# Patient Record
Sex: Female | Born: 1955 | Race: Black or African American | Hispanic: No | Marital: Single | Smoking: Never smoker
Health system: Southern US, Community
[De-identification: ages and names within clinical notes are randomized; demographics above are authoritative.]

## PROBLEM LIST (undated history)

## (undated) DIAGNOSIS — K219 Gastro-esophageal reflux disease without esophagitis: Secondary | ICD-10-CM

## (undated) DIAGNOSIS — I700 Atherosclerosis of aorta: Secondary | ICD-10-CM

## (undated) DIAGNOSIS — N189 Chronic kidney disease, unspecified: Secondary | ICD-10-CM

## (undated) DIAGNOSIS — Z8541 Personal history of malignant neoplasm of cervix uteri: Secondary | ICD-10-CM

## (undated) DIAGNOSIS — I10 Essential (primary) hypertension: Secondary | ICD-10-CM

## (undated) DIAGNOSIS — F1411 Cocaine abuse, in remission: Secondary | ICD-10-CM

## (undated) DIAGNOSIS — T7840XA Allergy, unspecified, initial encounter: Secondary | ICD-10-CM

## (undated) DIAGNOSIS — E559 Vitamin D deficiency, unspecified: Secondary | ICD-10-CM

## (undated) DIAGNOSIS — D649 Anemia, unspecified: Secondary | ICD-10-CM

## (undated) DIAGNOSIS — I708 Atherosclerosis of other arteries: Secondary | ICD-10-CM

## (undated) DIAGNOSIS — B182 Chronic viral hepatitis C: Secondary | ICD-10-CM

## (undated) DIAGNOSIS — C801 Malignant (primary) neoplasm, unspecified: Secondary | ICD-10-CM

## (undated) DIAGNOSIS — E785 Hyperlipidemia, unspecified: Secondary | ICD-10-CM

## (undated) DIAGNOSIS — Z905 Acquired absence of kidney: Secondary | ICD-10-CM

## (undated) DIAGNOSIS — A048 Other specified bacterial intestinal infections: Secondary | ICD-10-CM

## (undated) DIAGNOSIS — F329 Major depressive disorder, single episode, unspecified: Secondary | ICD-10-CM

## (undated) HISTORY — DX: Cocaine abuse, in remission: F14.11

## (undated) HISTORY — DX: Vitamin D deficiency, unspecified: E55.9

## (undated) HISTORY — DX: Atherosclerosis of aorta: I70.0

## (undated) HISTORY — PX: BOWEL RESECTION: SHX1257

## (undated) HISTORY — DX: Other specified bacterial intestinal infections: A04.8

## (undated) HISTORY — DX: Allergy, unspecified, initial encounter: T78.40XA

## (undated) HISTORY — DX: Acquired absence of kidney: Z90.5

## (undated) HISTORY — DX: Essential (primary) hypertension: I10

## (undated) HISTORY — DX: Major depressive disorder, single episode, unspecified: F32.9

## (undated) HISTORY — DX: Hyperlipidemia, unspecified: E78.5

## (undated) HISTORY — DX: Chronic viral hepatitis C: B18.2

## (undated) HISTORY — DX: Atherosclerosis of other arteries: I70.8

## (undated) HISTORY — DX: Gastro-esophageal reflux disease without esophagitis: K21.9

## (undated) HISTORY — DX: Personal history of malignant neoplasm of cervix uteri: Z85.41

## (undated) HISTORY — PX: ABDOMINAL HYSTERECTOMY: SHX81

## (undated) HISTORY — PX: OTHER SURGICAL HISTORY: SHX169

---

## 2003-03-17 ENCOUNTER — Encounter: Payer: Self-pay | Admitting: Emergency Medicine

## 2003-03-17 ENCOUNTER — Emergency Department (HOSPITAL_COMMUNITY): Admission: EM | Admit: 2003-03-17 | Discharge: 2003-03-17 | Payer: Self-pay | Admitting: Emergency Medicine

## 2003-03-30 ENCOUNTER — Encounter: Admission: RE | Admit: 2003-03-30 | Discharge: 2003-03-30 | Payer: Self-pay | Admitting: Internal Medicine

## 2003-04-09 ENCOUNTER — Encounter: Admission: RE | Admit: 2003-04-09 | Discharge: 2003-04-09 | Payer: Self-pay | Admitting: Infectious Diseases

## 2003-04-16 ENCOUNTER — Encounter: Admission: RE | Admit: 2003-04-16 | Discharge: 2003-04-16 | Payer: Self-pay | Admitting: Internal Medicine

## 2003-05-11 ENCOUNTER — Encounter: Admission: RE | Admit: 2003-05-11 | Discharge: 2003-05-11 | Payer: Self-pay | Admitting: Internal Medicine

## 2003-05-25 ENCOUNTER — Encounter: Admission: RE | Admit: 2003-05-25 | Discharge: 2003-05-25 | Payer: Self-pay | Admitting: Internal Medicine

## 2003-09-13 ENCOUNTER — Encounter: Admission: RE | Admit: 2003-09-13 | Discharge: 2003-09-13 | Payer: Self-pay | Admitting: Internal Medicine

## 2003-10-11 ENCOUNTER — Encounter: Admission: RE | Admit: 2003-10-11 | Discharge: 2003-10-11 | Payer: Self-pay | Admitting: Obstetrics and Gynecology

## 2003-12-14 ENCOUNTER — Encounter: Admission: RE | Admit: 2003-12-14 | Discharge: 2003-12-14 | Payer: Self-pay | Admitting: Internal Medicine

## 2004-02-14 ENCOUNTER — Encounter: Admission: RE | Admit: 2004-02-14 | Discharge: 2004-02-14 | Payer: Self-pay | Admitting: Internal Medicine

## 2004-03-10 ENCOUNTER — Encounter: Admission: RE | Admit: 2004-03-10 | Discharge: 2004-03-10 | Payer: Self-pay | Admitting: Internal Medicine

## 2004-04-14 ENCOUNTER — Encounter: Admission: RE | Admit: 2004-04-14 | Discharge: 2004-04-14 | Payer: Self-pay | Admitting: Internal Medicine

## 2004-04-28 ENCOUNTER — Encounter: Admission: RE | Admit: 2004-04-28 | Discharge: 2004-04-28 | Payer: Self-pay | Admitting: Internal Medicine

## 2004-04-30 ENCOUNTER — Encounter: Admission: RE | Admit: 2004-04-30 | Discharge: 2004-04-30 | Payer: Self-pay | Admitting: Internal Medicine

## 2004-05-13 ENCOUNTER — Ambulatory Visit: Payer: Self-pay | Admitting: Internal Medicine

## 2004-07-16 ENCOUNTER — Ambulatory Visit: Payer: Self-pay | Admitting: Internal Medicine

## 2004-09-17 ENCOUNTER — Ambulatory Visit: Payer: Self-pay | Admitting: Internal Medicine

## 2004-10-17 ENCOUNTER — Emergency Department (HOSPITAL_COMMUNITY): Admission: EM | Admit: 2004-10-17 | Discharge: 2004-10-17 | Payer: Self-pay | Admitting: Family Medicine

## 2004-12-03 ENCOUNTER — Ambulatory Visit: Payer: Self-pay | Admitting: Internal Medicine

## 2004-12-31 ENCOUNTER — Emergency Department (HOSPITAL_COMMUNITY): Admission: EM | Admit: 2004-12-31 | Discharge: 2004-12-31 | Payer: Self-pay | Admitting: Emergency Medicine

## 2005-03-13 ENCOUNTER — Ambulatory Visit: Payer: Self-pay | Admitting: Internal Medicine

## 2005-05-21 ENCOUNTER — Ambulatory Visit: Payer: Self-pay | Admitting: Internal Medicine

## 2006-07-19 DIAGNOSIS — Z8541 Personal history of malignant neoplasm of cervix uteri: Secondary | ICD-10-CM | POA: Insufficient documentation

## 2006-07-19 DIAGNOSIS — E785 Hyperlipidemia, unspecified: Secondary | ICD-10-CM

## 2006-07-19 DIAGNOSIS — I10 Essential (primary) hypertension: Secondary | ICD-10-CM | POA: Insufficient documentation

## 2006-07-19 DIAGNOSIS — Z905 Acquired absence of kidney: Secondary | ICD-10-CM | POA: Insufficient documentation

## 2006-07-19 DIAGNOSIS — R109 Unspecified abdominal pain: Secondary | ICD-10-CM | POA: Insufficient documentation

## 2006-07-19 DIAGNOSIS — F329 Major depressive disorder, single episode, unspecified: Secondary | ICD-10-CM | POA: Insufficient documentation

## 2006-07-19 DIAGNOSIS — F3289 Other specified depressive episodes: Secondary | ICD-10-CM | POA: Insufficient documentation

## 2006-07-19 DIAGNOSIS — Z9189 Other specified personal risk factors, not elsewhere classified: Secondary | ICD-10-CM | POA: Insufficient documentation

## 2006-07-19 DIAGNOSIS — K520 Gastroenteritis and colitis due to radiation: Secondary | ICD-10-CM

## 2006-08-10 ENCOUNTER — Encounter (INDEPENDENT_AMBULATORY_CARE_PROVIDER_SITE_OTHER): Payer: Self-pay | Admitting: Internal Medicine

## 2013-11-17 ENCOUNTER — Telehealth: Payer: Self-pay | Admitting: *Deleted

## 2013-12-08 NOTE — Telephone Encounter (Signed)
Open In Error/sls

## 2014-05-18 ENCOUNTER — Encounter: Payer: Self-pay | Admitting: Internal Medicine

## 2014-05-18 ENCOUNTER — Ambulatory Visit: Payer: Medicare Other | Attending: Internal Medicine | Admitting: Internal Medicine

## 2014-05-18 VITALS — BP 180/100 | HR 70 | Temp 97.8°F | Resp 16 | Wt 133.6 lb

## 2014-05-18 DIAGNOSIS — I10 Essential (primary) hypertension: Secondary | ICD-10-CM | POA: Diagnosis not present

## 2014-05-18 DIAGNOSIS — IMO0001 Reserved for inherently not codable concepts without codable children: Secondary | ICD-10-CM

## 2014-05-18 DIAGNOSIS — Z139 Encounter for screening, unspecified: Secondary | ICD-10-CM

## 2014-05-18 DIAGNOSIS — K219 Gastro-esophageal reflux disease without esophagitis: Secondary | ICD-10-CM

## 2014-05-18 DIAGNOSIS — R030 Elevated blood-pressure reading, without diagnosis of hypertension: Secondary | ICD-10-CM

## 2014-05-18 LAB — CBC WITH DIFFERENTIAL/PLATELET
BASOS ABS: 0 10*3/uL (ref 0.0–0.1)
BASOS PCT: 0 % (ref 0–1)
EOS PCT: 2 % (ref 0–5)
Eosinophils Absolute: 0.1 10*3/uL (ref 0.0–0.7)
HEMATOCRIT: 34.4 % — AB (ref 36.0–46.0)
Hemoglobin: 11.5 g/dL — ABNORMAL LOW (ref 12.0–15.0)
LYMPHS PCT: 40 % (ref 12–46)
Lymphs Abs: 2.8 10*3/uL (ref 0.7–4.0)
MCH: 30.6 pg (ref 26.0–34.0)
MCHC: 33.4 g/dL (ref 30.0–36.0)
MCV: 91.5 fL (ref 78.0–100.0)
MONO ABS: 0.5 10*3/uL (ref 0.1–1.0)
Monocytes Relative: 7 % (ref 3–12)
Neutro Abs: 3.5 10*3/uL (ref 1.7–7.7)
Neutrophils Relative %: 51 % (ref 43–77)
Platelets: 381 10*3/uL (ref 150–400)
RBC: 3.76 MIL/uL — ABNORMAL LOW (ref 3.87–5.11)
RDW: 14.7 % (ref 11.5–15.5)
WBC: 6.9 10*3/uL (ref 4.0–10.5)

## 2014-05-18 MED ORDER — AMLODIPINE BESYLATE 5 MG PO TABS: 5.0000 mg | ORAL_TABLET | Freq: Every day | ORAL | Status: DC

## 2014-05-18 MED ORDER — LISINOPRIL-HYDROCHLOROTHIAZIDE 20-25 MG PO TABS: 1.0000 | ORAL_TABLET | Freq: Every day | ORAL | Status: DC

## 2014-05-18 MED ORDER — METOPROLOL SUCCINATE ER 200 MG PO TB24: 200.0000 mg | ORAL_TABLET | Freq: Every day | ORAL | Status: DC

## 2014-05-18 MED ORDER — CLONIDINE HCL 0.1 MG PO TABS
0.2000 mg | ORAL_TABLET | Freq: Once | ORAL | Status: AC
  Administered 2014-05-18: 0.2 mg via ORAL

## 2014-05-18 MED ORDER — OMEPRAZOLE 40 MG PO CPDR: 40.0000 mg | DELAYED_RELEASE_CAPSULE | Freq: Every day | ORAL | Status: DC

## 2014-05-18 NOTE — Progress Notes (Signed)
Patient Demographics  Janice Deleon, is a 58 y.o. female  PPJ:[id number]  PYK:[id number]  DOB - 22-Feb-1956  CC:  Chief Complaint  Patient presents with  . Establish Care       HPI: Janice Deleon is a 58 y.o. female here today to establish medical care. History of hypertension, hyperlipidemia, patient reported to be on several blood pressure medications including lisinopril metoprolol, Adalat  and hydrochlorothiazide, as per patient she ran out of her blood pressure medication for the last 2 months her blood pressure today is elevated, she was given clonidine even repeat manual blood pressure is 180/100 she denies any headache dizziness chest and shortness of breath. Patient has No headache, No chest pain, No abdominal pain - No Nausea, No new weakness tingling or numbness, No Cough - SOB.  Allergies  Allergen Reactions  . Hydrocodone-Acetaminophen     REACTION: Unknown reaction   Past Medical History  Diagnosis Date  . Hypertension   . GERD (gastroesophageal reflux disease)    No current outpatient prescriptions on file prior to visit.   No current facility-administered medications on file prior to visit.   Family History  Problem Relation Age of Onset  . Hypertension Mother   . Diabetes Mother   . Heart disease Mother   . Hypertension Father   . Cancer Father     colon cancer   . Hypertension Sister   . Diabetes Sister   . Hypertension Brother   . Diabetes Brother   . Stroke Maternal Aunt    History   Social History  . Marital Status: Single    Spouse Name: N/A    Number of Children: N/A  . Years of Education: N/A   Occupational History  . Not on file.   Social History Main Topics  . Smoking status: Never Smoker   . Smokeless tobacco: Not on file  . Alcohol Use: Yes  . Drug Use: Not on file  . Sexual Activity: Not on file   Other Topics Concern  . Not on file   Social History Narrative  . No narrative on file    Review of  Systems: Constitutional: Negative for fever, chills, diaphoresis, activity change, appetite change and fatigue. HENT: Negative for ear pain, nosebleeds, congestion, facial swelling, rhinorrhea, neck pain, neck stiffness and ear discharge.  Eyes: Negative for pain, discharge, redness, itching and visual disturbance. Respiratory: Negative for cough, choking, chest tightness, shortness of breath, wheezing and stridor.  Cardiovascular: Negative for chest pain, palpitations and leg swelling. Gastrointestinal: Negative for abdominal distention. Genitourinary: Negative for dysuria, urgency, frequency, hematuria, flank pain, decreased urine volume, difficulty urinating and dyspareunia.  Musculoskeletal: Negative for back pain, joint swelling, arthralgia and gait problem. Neurological: Negative for dizziness, tremors, seizures, syncope, facial asymmetry, speech difficulty, weakness, light-headedness, numbness and headaches.  Hematological: Negative for adenopathy. Does not bruise/bleed easily. Psychiatric/Behavioral: Negative for hallucinations, behavioral problems, confusion, dysphoric mood, decreased concentration and agitation.    Objective:   Filed Vitals:   05/18/14 1635  BP: 180/100  Pulse:   Temp:   Resp:     Physical Exam: Constitutional: Patient appears well-developed and well-nourished. No distress. HENT: Normocephalic, atraumatic, External right and left ear normal. Oropharynx is clear and moist.  Eyes: Conjunctivae and EOM are normal. PERRLA, no scleral icterus. Neck: Normal ROM. Neck supple. No JVD. No tracheal deviation. No thyromegaly. CVS: RRR, S1/S2 +, no murmurs, no gallops, no carotid bruit.  Pulmonary: Effort and breath sounds normal, no stridor, rhonchi,  wheezes, rales.  Abdominal: Soft. BS +, no distension, tenderness, rebound or guarding. Old surgical scars Musculoskeletal: Normal range of motion. No edema and no tenderness.  Neuro: Alert. Normal reflexes, muscle tone  coordination. No cranial nerve deficit. Skin: Skin is warm and dry. No rash noted. Not diaphoretic. No erythema. No pallor. Psychiatric: Normal mood and affect. Behavior, judgment, thought content normal.  No results found for this basename: WBC, HGB, HCT, MCV, PLT   No results found for this basename: CREATININE, BUN, NA, K, CL, CO2    No results found for this basename: HGBA1C   Lipid Panel  No results found for this basename: chol, trig, hdl, cholhdl, vldl, ldlcalc       Assessment and plan:   1. Elevated blood pressure  - cloNIDine (CATAPRES) tablet 0.2 mg; Take 2 tablets (0.2 mg total) by mouth once.  2. Essential hypertension, benign Have advised patient for DASH diet, since her blood pressures on lower side have discontinued her nifedipine and switch to Norvasc, prescribed combination of lisinopril and hydrochlorothiazide, continue with metoprolol, she'll come back in 2 weeks for BP check. - COMPLETE METABOLIC PANEL WITH GFR - metoprolol (TOPROL-XL) 200 MG 24 hr tablet; Take 1 tablet (200 mg total) by mouth daily.  Dispense: 30 tablet; Refill: 3 - lisinopril-hydrochlorothiazide (PRINZIDE,ZESTORETIC) 20-25 MG per tablet; Take 1 tablet by mouth daily.  Dispense: 90 tablet; Refill: 3 - amLODipine (NORVASC) 5 MG tablet; Take 1 tablet (5 mg total) by mouth daily.  Dispense: 90 tablet; Refill: 3  3. Screening Ordered baseline blood work - CBC with Differential - TSH - Vit D  25 hydroxy (rtn osteoporosis monitoring) - MM DIGITAL SCREENING BILATERAL; Future - Hemoglobin A1c  4. Gastroesophageal reflux disease, esophagitis presence not specified Advised for lifestyle modification continue with her - omeprazole (PRILOSEC) 40 MG capsule; Take 1 capsule (40 mg total) by mouth daily.  Dispense: 30 capsule; Refill: 3     Health Maintenance -Colonoscopy: uptodate   -Mammogram: ordered  Patient declined flu shot    Return in about 3 months (around 08/17/2014) for hypertension,  BP check in 2 weeks/Nurse Visit.     Lorayne Marek, MD

## 2014-05-18 NOTE — Progress Notes (Signed)
Patient here to establish care Has not had her medications in about two months Presents in office with elevated blood pressure Patient has declined the flu vaccine

## 2014-05-18 NOTE — Patient Instructions (Signed)
DASH Eating Plan °DASH stands for "Dietary Approaches to Stop Hypertension." The DASH eating plan is a healthy eating plan that has been shown to reduce high blood pressure (hypertension). Additional health benefits may include reducing the risk of type 2 diabetes mellitus, heart disease, and stroke. The DASH eating plan may also help with weight loss. °WHAT DO I NEED TO KNOW ABOUT THE DASH EATING PLAN? °For the DASH eating plan, you will follow these general guidelines: °· Choose foods with a percent daily value for sodium of less than 5% (as listed on the food label). °· Use salt-free seasonings or herbs instead of table salt or sea salt. °· Check with your health care provider or pharmacist before using salt substitutes. °· Eat lower-sodium products, often labeled as "lower sodium" or "no salt added." °· Eat fresh foods. °· Eat more vegetables, fruits, and low-fat dairy products. °· Choose whole grains. Look for the word "whole" as the first word in the ingredient list. °· Choose fish and skinless chicken or turkey more often than red meat. Limit fish, poultry, and meat to 6 oz (170 g) each day. °· Limit sweets, desserts, sugars, and sugary drinks. °· Choose heart-healthy fats. °· Limit cheese to 1 oz (28 g) per day. °· Eat more home-cooked food and less restaurant, buffet, and fast food. °· Limit fried foods. °· Cook foods using methods other than frying. °· Limit canned vegetables. If you do use them, rinse them well to decrease the sodium. °· When eating at a restaurant, ask that your food be prepared with less salt, or no salt if possible. °WHAT FOODS CAN I EAT? °Seek help from a dietitian for individual calorie needs. °Grains °Whole grain or whole wheat bread. Brown rice. Whole grain or whole wheat pasta. Quinoa, bulgur, and whole grain cereals. Low-sodium cereals. Corn or whole wheat flour tortillas. Whole grain cornbread. Whole grain crackers. Low-sodium crackers. °Vegetables °Fresh or frozen vegetables  (raw, steamed, roasted, or grilled). Low-sodium or reduced-sodium tomato and vegetable juices. Low-sodium or reduced-sodium tomato sauce and paste. Low-sodium or reduced-sodium canned vegetables.  °Fruits °All fresh, canned (in natural juice), or frozen fruits. °Meat and Other Protein Products °Ground beef (85% or leaner), grass-fed beef, or beef trimmed of fat. Skinless chicken or turkey. Ground chicken or turkey. Pork trimmed of fat. All fish and seafood. Eggs. Dried beans, peas, or lentils. Unsalted nuts and seeds. Unsalted canned beans. °Dairy °Low-fat dairy products, such as skim or 1% milk, 2% or reduced-fat cheeses, low-fat ricotta or cottage cheese, or plain low-fat yogurt. Low-sodium or reduced-sodium cheeses. °Fats and Oils °Tub margarines without trans fats. Light or reduced-fat mayonnaise and salad dressings (reduced sodium). Avocado. Safflower, olive, or canola oils. Natural peanut or almond butter. °Other °Unsalted popcorn and pretzels. °The items listed above may not be a complete list of recommended foods or beverages. Contact your dietitian for more options. °WHAT FOODS ARE NOT RECOMMENDED? °Grains °White bread. White pasta. White rice. Refined cornbread. Bagels and croissants. Crackers that contain trans fat. °Vegetables °Creamed or fried vegetables. Vegetables in a cheese sauce. Regular canned vegetables. Regular canned tomato sauce and paste. Regular tomato and vegetable juices. °Fruits °Dried fruits. Canned fruit in light or heavy syrup. Fruit juice. °Meat and Other Protein Products °Fatty cuts of meat. Ribs, chicken wings, bacon, sausage, bologna, salami, chitterlings, fatback, hot dogs, bratwurst, and packaged luncheon meats. Salted nuts and seeds. Canned beans with salt. °Dairy °Whole or 2% milk, cream, half-and-half, and cream cheese. Whole-fat or sweetened yogurt. Full-fat   cheeses or blue cheese. Nondairy creamers and whipped toppings. Processed cheese, cheese spreads, or cheese  curds. °Condiments °Onion and garlic salt, seasoned salt, table salt, and sea salt. Canned and packaged gravies. Worcestershire sauce. Tartar sauce. Barbecue sauce. Teriyaki sauce. Soy sauce, including reduced sodium. Steak sauce. Fish sauce. Oyster sauce. Cocktail sauce. Horseradish. Ketchup and mustard. Meat flavorings and tenderizers. Bouillon cubes. Hot sauce. Tabasco sauce. Marinades. Taco seasonings. Relishes. °Fats and Oils °Butter, stick margarine, lard, shortening, ghee, and bacon fat. Coconut, palm kernel, or palm oils. Regular salad dressings. °Other °Pickles and olives. Salted popcorn and pretzels. °The items listed above may not be a complete list of foods and beverages to avoid. Contact your dietitian for more information. °WHERE CAN I FIND MORE INFORMATION? °National Heart, Lung, and Blood Institute: www.nhlbi.nih.gov/health/health-topics/topics/dash/ °Document Released: 08/06/2011 Document Revised: 01/01/2014 Document Reviewed: 06/21/2013 °Exit[employer]® Patient Information ©2015 [employer]. This information is not intended to replace advice given to you by your health care provider. Make sure you discuss any questions you have with your health care provider. ° °

## 2014-05-19 LAB — COMPLETE METABOLIC PANEL WITH GFR
ALBUMIN: 4.2 g/dL (ref 3.5–5.2)
ALT: 10 U/L (ref 0–35)
AST: 15 U/L (ref 0–37)
Alkaline Phosphatase: 70 U/L (ref 39–117)
BUN: 9 mg/dL (ref 6–23)
CALCIUM: 9.3 mg/dL (ref 8.4–10.5)
CO2: 29 meq/L (ref 19–32)
Chloride: 105 mEq/L (ref 96–112)
Creat: 0.66 mg/dL (ref 0.50–1.10)
GLUCOSE: 89 mg/dL (ref 70–99)
Potassium: 4.3 mEq/L (ref 3.5–5.3)
Sodium: 139 mEq/L (ref 135–145)
Total Bilirubin: 0.2 mg/dL (ref 0.2–1.2)
Total Protein: 7 g/dL (ref 6.0–8.3)

## 2014-05-19 LAB — TSH: TSH: 2.402 u[IU]/mL (ref 0.350–4.500)

## 2014-05-19 LAB — HEMOGLOBIN A1C
Hgb A1c MFr Bld: 5.7 % — ABNORMAL HIGH (ref <5.7)
MEAN PLASMA GLUCOSE: 117 mg/dL — AB (ref <117)

## 2014-05-19 LAB — VITAMIN D 25 HYDROXY (VIT D DEFICIENCY, FRACTURES): VIT D 25 HYDROXY: 26 ng/mL — AB (ref 30–89)

## 2014-05-21 ENCOUNTER — Telehealth: Payer: Self-pay | Admitting: Emergency Medicine

## 2014-05-21 MED ORDER — VITAMIN D (ERGOCALCIFEROL) 1.25 MG (50000 UNIT) PO CAPS: 50000.0000 [IU] | ORAL_CAPSULE | ORAL | Status: DC

## 2014-05-21 NOTE — Telephone Encounter (Signed)
Message copied by Ricci Barker on Mon May 21, 2014  2:31 PM ------      Message from: Lorayne Marek      Created: Mon May 21, 2014  1:21 PM       Blood work reviewed, noticed low vitamin D, call patient advise to start ergocalciferol 50,000 units once a week for the duration of  12 weeks.      Also patient has borderline anemia, advise patient take over-the-counter iron supplements daily.       ------

## 2014-05-21 NOTE — Telephone Encounter (Signed)
Pt given lab results with instructions to start taking prescribed Ergocalciferol 50,000 units once a week  Medication will be e-scribed to [facility] for pt to receive Pt also instructed to start taking OTC iron supplements for low iron

## 2014-06-14 ENCOUNTER — Ambulatory Visit (HOSPITAL_COMMUNITY): Admission: RE | Admit: 2014-06-14 | Payer: Medicaid Other | Source: Ambulatory Visit

## 2014-06-27 ENCOUNTER — Other Ambulatory Visit: Payer: Self-pay | Admitting: Internal Medicine

## 2014-06-27 ENCOUNTER — Ambulatory Visit (HOSPITAL_COMMUNITY)
Admission: RE | Admit: 2014-06-27 | Discharge: 2014-06-27 | Disposition: A | Discharge status: Home / Self Care | Payer: Medicare Other | Source: Ambulatory Visit | Attending: Internal Medicine | Admitting: Internal Medicine

## 2014-06-27 DIAGNOSIS — Z1231 Encounter for screening mammogram for malignant neoplasm of breast: Secondary | ICD-10-CM

## 2014-06-27 DIAGNOSIS — Z139 Encounter for screening, unspecified: Secondary | ICD-10-CM

## 2014-09-11 ENCOUNTER — Ambulatory Visit: Payer: Medicare Other | Admitting: Internal Medicine

## 2014-10-23 ENCOUNTER — Encounter: Payer: Self-pay | Admitting: Internal Medicine

## 2014-10-23 ENCOUNTER — Ambulatory Visit: Payer: Medicare Other | Attending: Internal Medicine | Admitting: Internal Medicine

## 2014-10-23 VITALS — BP 165/90 | HR 72 | Temp 99.2°F | Resp 16 | Wt 142.0 lb

## 2014-10-23 DIAGNOSIS — D649 Anemia, unspecified: Secondary | ICD-10-CM | POA: Diagnosis not present

## 2014-10-23 DIAGNOSIS — R350 Frequency of micturition: Secondary | ICD-10-CM | POA: Diagnosis not present

## 2014-10-23 DIAGNOSIS — I10 Essential (primary) hypertension: Secondary | ICD-10-CM | POA: Insufficient documentation

## 2014-10-23 DIAGNOSIS — IMO0001 Reserved for inherently not codable concepts without codable children: Secondary | ICD-10-CM

## 2014-10-23 DIAGNOSIS — E785 Hyperlipidemia, unspecified: Secondary | ICD-10-CM | POA: Diagnosis not present

## 2014-10-23 DIAGNOSIS — R030 Elevated blood-pressure reading, without diagnosis of hypertension: Secondary | ICD-10-CM | POA: Diagnosis not present

## 2014-10-23 DIAGNOSIS — Z79899 Other long term (current) drug therapy: Secondary | ICD-10-CM | POA: Diagnosis not present

## 2014-10-23 DIAGNOSIS — K219 Gastro-esophageal reflux disease without esophagitis: Secondary | ICD-10-CM | POA: Diagnosis not present

## 2014-10-23 DIAGNOSIS — E559 Vitamin D deficiency, unspecified: Secondary | ICD-10-CM | POA: Diagnosis not present

## 2014-10-23 LAB — POCT URINALYSIS DIPSTICK
BILIRUBIN UA: NEGATIVE
Glucose, UA: NEGATIVE
Ketones, UA: NEGATIVE
Leukocytes, UA: NEGATIVE
NITRITE UA: NEGATIVE
Protein, UA: NEGATIVE
RBC UA: NEGATIVE
Urobilinogen, UA: 0.2
pH, UA: 5.5

## 2014-10-23 LAB — COMPLETE METABOLIC PANEL WITH GFR
ALT: 13 U/L (ref 0–35)
AST: 12 U/L (ref 0–37)
Albumin: 4.2 g/dL (ref 3.5–5.2)
Alkaline Phosphatase: 72 U/L (ref 39–117)
BILIRUBIN TOTAL: 0.3 mg/dL (ref 0.2–1.2)
BUN: 12 mg/dL (ref 6–23)
CO2: 26 meq/L (ref 19–32)
CREATININE: 0.75 mg/dL (ref 0.50–1.10)
Calcium: 9.6 mg/dL (ref 8.4–10.5)
Chloride: 104 mEq/L (ref 96–112)
GFR, Est Non African American: 89 mL/min
Glucose, Bld: 91 mg/dL (ref 70–99)
POTASSIUM: 4.5 meq/L (ref 3.5–5.3)
SODIUM: 137 meq/L (ref 135–145)
Total Protein: 7.5 g/dL (ref 6.0–8.3)

## 2014-10-23 MED ORDER — OMEPRAZOLE 20 MG PO CPDR: 20.0000 mg | DELAYED_RELEASE_CAPSULE | Freq: Every day | ORAL | Status: DC

## 2014-10-23 MED ORDER — CLONIDINE HCL 0.1 MG PO TABS
0.2000 mg | ORAL_TABLET | Freq: Once | ORAL | Status: AC
  Administered 2014-10-23: 0.2 mg via ORAL

## 2014-10-23 MED ORDER — METOPROLOL SUCCINATE ER 200 MG PO TB24: 200.0000 mg | ORAL_TABLET | Freq: Every day | ORAL | Status: DC

## 2014-10-23 MED ORDER — AMLODIPINE BESYLATE 5 MG PO TABS: 5.0000 mg | ORAL_TABLET | Freq: Every day | ORAL | Status: DC

## 2014-10-23 MED ORDER — LISINOPRIL-HYDROCHLOROTHIAZIDE 20-25 MG PO TABS: 1.0000 | ORAL_TABLET | Freq: Every day | ORAL | Status: DC

## 2014-10-23 NOTE — Patient Instructions (Addendum)
DASH Eating Plan DASH stands for "Dietary Approaches to Stop Hypertension." The DASH eating plan is a healthy eating plan that has been shown to reduce high blood pressure (hypertension). Additional health benefits may include reducing the risk of type 2 diabetes mellitus, heart disease, and stroke. The DASH eating plan may also help with weight loss. WHAT DO I NEED TO KNOW ABOUT THE DASH EATING PLAN? For the DASH eating plan, you will follow these general guidelines:  Choose foods with a percent daily value for sodium of less than 5% (as listed on the food label).  Use salt-free seasonings or herbs instead of table salt or sea salt.  Check with your health care provider or pharmacist before using salt substitutes.  Eat lower-sodium products, often labeled as "lower sodium" or "no salt added."  Eat fresh foods.  Eat more vegetables, fruits, and low-fat dairy products.  Choose whole grains. Look for the word "whole" as the first word in the ingredient list.  Choose fish and skinless chicken or turkey more often than red meat. Limit fish, poultry, and meat to 6 oz (170 g) each day.  Limit sweets, desserts, sugars, and sugary drinks.  Choose heart-healthy fats.  Limit cheese to 1 oz (28 g) per day.  Eat more home-cooked food and less restaurant, buffet, and fast food.  Limit fried foods.  Cook foods using methods other than frying.  Limit canned vegetables. If you do use them, rinse them well to decrease the sodium.  When eating at a restaurant, ask that your food be prepared with less salt, or no salt if possible. WHAT FOODS CAN I EAT? Seek help from a dietitian for individual calorie needs. Grains Whole grain or whole wheat bread. Brown rice. Whole grain or whole wheat pasta. Quinoa, bulgur, and whole grain cereals. Low-sodium cereals. Corn or whole wheat flour tortillas. Whole grain cornbread. Whole grain crackers. Low-sodium crackers. Vegetables Fresh or frozen vegetables  (raw, steamed, roasted, or grilled). Low-sodium or reduced-sodium tomato and vegetable juices. Low-sodium or reduced-sodium tomato sauce and paste. Low-sodium or reduced-sodium canned vegetables.  Fruits All fresh, canned (in natural juice), or frozen fruits. Meat and Other Protein Products Ground beef (85% or leaner), grass-fed beef, or beef trimmed of fat. Skinless chicken or turkey. Ground chicken or turkey. Pork trimmed of fat. All fish and seafood. Eggs. Dried beans, peas, or lentils. Unsalted nuts and seeds. Unsalted canned beans. Dairy Low-fat dairy products, such as skim or 1% milk, 2% or reduced-fat cheeses, low-fat ricotta or cottage cheese, or plain low-fat yogurt. Low-sodium or reduced-sodium cheeses. Fats and Oils Tub margarines without trans fats. Light or reduced-fat mayonnaise and salad dressings (reduced sodium). Avocado. Safflower, olive, or canola oils. Natural peanut or almond butter. Other Unsalted popcorn and pretzels. The items listed above may not be a complete list of recommended foods or beverages. Contact your dietitian for more options. WHAT FOODS ARE NOT RECOMMENDED? Grains White bread. White pasta. White rice. Refined cornbread. Bagels and croissants. Crackers that contain trans fat. Vegetables Creamed or fried vegetables. Vegetables in a cheese sauce. Regular canned vegetables. Regular canned tomato sauce and paste. Regular tomato and vegetable juices. Fruits Dried fruits. Canned fruit in light or heavy syrup. Fruit juice. Meat and Other Protein Products Fatty cuts of meat. Ribs, chicken wings, bacon, sausage, bologna, salami, chitterlings, fatback, hot dogs, bratwurst, and packaged luncheon meats. Salted nuts and seeds. Canned beans with salt. Dairy Whole or 2% milk, cream, half-and-half, and cream cheese. Whole-fat or sweetened yogurt. Full-fat   cheeses or blue cheese. Nondairy creamers and whipped toppings. Processed cheese, cheese spreads, or cheese  curds. Condiments Onion and garlic salt, seasoned salt, table salt, and sea salt. Canned and packaged gravies. Worcestershire sauce. Tartar sauce. Barbecue sauce. Teriyaki sauce. Soy sauce, including reduced sodium. Steak sauce. Fish sauce. Oyster sauce. Cocktail sauce. Horseradish. Ketchup and mustard. Meat flavorings and tenderizers. Bouillon cubes. Hot sauce. Tabasco sauce. Marinades. Taco seasonings. Relishes. Fats and Oils Butter, stick margarine, lard, shortening, ghee, and bacon fat. Coconut, palm kernel, or palm oils. Regular salad dressings. Other Pickles and olives. Salted popcorn and pretzels. The items listed above may not be a complete list of foods and beverages to avoid. Contact your dietitian for more information. WHERE CAN I FIND MORE INFORMATION? National Heart, Lung, and Blood Institute: travelstabloid.com Document Released: 08/06/2011 Document Revised: 01/01/2014 Document Reviewed: 06/21/2013 [facility] Patient Information 2015 [place], [place]. This information is not intended to replace advice given to you by your health care provider. Make sure you discuss any questions you have with your health care provider.   Also take OTC iron supplement and vitamin D 2000 units every day.

## 2014-10-23 NOTE — Progress Notes (Signed)
MRN: [id number] Name: Janice Deleon  Sex: female Age: 59 y.o. DOB: 1955/12/30  Allergies: Hydrocodone-acetaminophen  Chief Complaint  Patient presents with  . Follow-up    HPI: Patient is 59 y.o. female who has history of hypertension, GERD, hyperlipidemia, comes today for followup requesting refill on her medications, today her blood pressure is elevated since she ran out of her medication for the last 2 months, she was given clonidine, repeat manual blood pressure is 165/90, she denies any headache dizziness chest and shortness of breath, she has been complaining of urinary frequency denies any dysuria, UA in the office today is negative for any infection, patient reported that she drinks a lot of coffee and tea and does not drink water without much, also complaining of GERD symptoms.  Past Medical History  Diagnosis Date  . Hypertension   . GERD (gastroesophageal reflux disease)     Past Surgical History  Procedure Laterality Date  . Abdominal hysterectomy    . Right nephrectomy     . Colonscopy       at the age of 59      Medication List       This list is accurate as of: 10/23/14 11:06 AM.  Always use your most recent med list.               amLODipine 5 MG tablet  Commonly known as:  NORVASC  Take 1 tablet (5 mg total) by mouth daily.     lisinopril-hydrochlorothiazide 20-25 MG per tablet  Commonly known as:  PRINZIDE,ZESTORETIC  Take 1 tablet by mouth daily.     metoprolol 200 MG 24 hr tablet  Commonly known as:  TOPROL-XL  Take 1 tablet (200 mg total) by mouth daily.     omeprazole 20 MG capsule  Commonly known as:  PRILOSEC  Take 1 capsule (20 mg total) by mouth daily.     traMADol 50 MG tablet  Commonly known as:  ULTRAM  Take by mouth every 6 (six) hours as needed.     Vitamin D (Ergocalciferol) 50000 UNITS Caps capsule  Commonly known as:  DRISDOL  Take 1 capsule (50,000 Units total) by mouth every 7 (seven) days.        Meds ordered  this encounter  Medications  . cloNIDine (CATAPRES) tablet 0.2 mg    Sig:   . omeprazole (PRILOSEC) 20 MG capsule    Sig: Take 1 capsule (20 mg total) by mouth daily.    Dispense:  30 capsule    Refill:  3  . metoprolol (TOPROL-XL) 200 MG 24 hr tablet    Sig: Take 1 tablet (200 mg total) by mouth daily.    Dispense:  30 tablet    Refill:  3  . lisinopril-hydrochlorothiazide (PRINZIDE,ZESTORETIC) 20-25 MG per tablet    Sig: Take 1 tablet by mouth daily.    Dispense:  90 tablet    Refill:  3  . amLODipine (NORVASC) 5 MG tablet    Sig: Take 1 tablet (5 mg total) by mouth daily.    Dispense:  90 tablet    Refill:  3     There is no immunization history on file for this patient.  Family History  Problem Relation Age of Onset  . Hypertension Mother   . Diabetes Mother   . Heart disease Mother   . Hypertension Father   . Cancer Father     colon cancer   . Hypertension Sister   . Diabetes  Sister   . Hypertension Brother   . Diabetes Brother   . Stroke Maternal Aunt     History  Substance Use Topics  . Smoking status: Never Smoker   . Smokeless tobacco: Not on file  . Alcohol Use: Yes    Review of Systems   As noted in HPI  Filed Vitals:   10/23/14 1102  BP: 165/90  Pulse:   Temp:   Resp:     Physical Exam  Physical Exam  Constitutional: No distress.  Eyes: EOM are normal. Pupils are equal, round, and reactive to light.  Cardiovascular: Normal rate and regular rhythm.   Pulmonary/Chest: Breath sounds normal. No respiratory distress. She has no wheezes. She has no rales.  Abdominal: Soft. There is no tenderness. There is no rebound.  Musculoskeletal: She exhibits no edema.    CBC    Component Value Date/Time   WBC 6.9 05/18/2014 1641   RBC 3.76* 05/18/2014 1641   HGB 11.5* 05/18/2014 1641   HCT 34.4* 05/18/2014 1641   PLT 381 05/18/2014 1641   MCV 91.5 05/18/2014 1641   LYMPHSABS 2.8 05/18/2014 1641   MONOABS 0.5 05/18/2014 1641   EOSABS 0.1  05/18/2014 1641   BASOSABS 0.0 05/18/2014 1641    CMP     Component Value Date/Time   NA 139 05/18/2014 1641   K 4.3 05/18/2014 1641   CL 105 05/18/2014 1641   CO2 29 05/18/2014 1641   GLUCOSE 89 05/18/2014 1641   BUN 9 05/18/2014 1641   CREATININE 0.66 05/18/2014 1641   CALCIUM 9.3 05/18/2014 1641   PROT 7.0 05/18/2014 1641   ALBUMIN 4.2 05/18/2014 1641   AST 15 05/18/2014 1641   ALT 10 05/18/2014 1641   ALKPHOS 70 05/18/2014 1641   BILITOT 0.2 05/18/2014 1641   GFRNONAA >89 05/18/2014 1641   GFRAA >89 05/18/2014 1641    No results found for: CHOL  No components found for: HGA1C  Lab Results  Component Value Date/Time   AST 15 05/18/2014 04:41 PM    Assessment and Plan  Essential hypertension, benign - Plan: blood pressure is uncontrolled secondary to not being on the medication, will resume back on her meds, also advise for DASH diet, advise patient .metoprolol (TOPROL-XL) 200 MG 24 hr tablet, lisinopril-hydrochlorothiazide (PRINZIDE,ZESTORETIC) 20-25 MG per tablet, amLODipine (NORVASC) 5 MG tablet, COMPLETE METABOLIC PANEL WITH GFR  Elevated blood pressure - Plan: cloNIDine (CATAPRES) tablet 0.2 mg  Frequent urination - Plan:  Results for orders placed or performed in visit on 10/23/14  Urinalysis Dipstick  Result Value Ref Range   Color, UA yellow    Clarity, UA clear    Glucose, UA neg    Bilirubin, UA neg    Ketones, UA neg    Spec Grav, UA <=1.005    Blood, UA neg    pH, UA 5.5    Protein, UA neg    Urobilinogen, UA 0.2    Nitrite, UA neg    Leukocytes, UA Negative    Urinalysis Dipstick is negative for infection, I have advised patient to decrease the caffeine intake.  Vitamin D deficiency Patient will start taking over-the-counter vitamin D 2000 units every  Gastroesophageal reflux disease, esophagitis presence not specified - Plan:advised patient for lifestyle modification, prescribed omeprazole (PRILOSEC) 20 MG capsule  Anemia, unspecified  anemia type Patient will start taking over-the-counter iron pills  Health Maintenance -Colonoscopy: as per patient had one within 5 years obtain the records  -Pap Smear: will be scheduled  -  Mammogram: uptodate  -Vaccinations:  -patient declines flu shot    Return in about 3 months (around 01/21/2015) for hypertension, BP check in 2 weeks/Nurse Visit, Schedule Appt with Dr Burman Freestone for PAP.   This note has been created with Surveyor, quantity. Any transcriptional errors are unintentional.    Lorayne Marek, MD

## 2014-10-23 NOTE — Progress Notes (Signed)
Patient here for follow up on her HTN Patient has been out of her medications for about two months Presents in office with elevated blood pressure Patient also complains of frequent urination

## 2014-10-25 ENCOUNTER — Telehealth: Payer: Self-pay

## 2014-10-25 NOTE — Telephone Encounter (Signed)
Patient is aware of her lab results 

## 2014-10-25 NOTE — Telephone Encounter (Signed)
-----   Message from Lorayne Marek, MD sent at 10/24/2014  9:14 AM EST ----- Call and let the Patient know that blood work is normal.

## 2014-11-27 ENCOUNTER — Encounter: Payer: Self-pay | Admitting: Family Medicine

## 2014-11-27 ENCOUNTER — Other Ambulatory Visit (HOSPITAL_COMMUNITY)
Admission: RE | Admit: 2014-11-27 | Discharge: 2014-11-27 | Disposition: A | Discharge status: Home / Self Care | Payer: Medicare Other | Source: Ambulatory Visit | Attending: Family Medicine | Admitting: Family Medicine

## 2014-11-27 ENCOUNTER — Ambulatory Visit: Payer: Medicare Other | Attending: Family Medicine | Admitting: Family Medicine

## 2014-11-27 VITALS — BP 160/94 | HR 73 | Temp 98.9°F | Resp 16 | Ht 63.0 in | Wt 140.0 lb

## 2014-11-27 DIAGNOSIS — N898 Other specified noninflammatory disorders of vagina: Secondary | ICD-10-CM | POA: Diagnosis not present

## 2014-11-27 DIAGNOSIS — Z8541 Personal history of malignant neoplasm of cervix uteri: Secondary | ICD-10-CM | POA: Insufficient documentation

## 2014-11-27 DIAGNOSIS — A599 Trichomoniasis, unspecified: Secondary | ICD-10-CM

## 2014-11-27 DIAGNOSIS — Z923 Personal history of irradiation: Secondary | ICD-10-CM | POA: Diagnosis not present

## 2014-11-27 DIAGNOSIS — Z124 Encounter for screening for malignant neoplasm of cervix: Secondary | ICD-10-CM | POA: Diagnosis not present

## 2014-11-27 DIAGNOSIS — B9689 Other specified bacterial agents as the cause of diseases classified elsewhere: Secondary | ICD-10-CM

## 2014-11-27 DIAGNOSIS — Z114 Encounter for screening for human immunodeficiency virus [HIV]: Secondary | ICD-10-CM

## 2014-11-27 DIAGNOSIS — N760 Acute vaginitis: Secondary | ICD-10-CM

## 2014-11-27 DIAGNOSIS — I10 Essential (primary) hypertension: Secondary | ICD-10-CM | POA: Diagnosis not present

## 2014-11-27 DIAGNOSIS — A499 Bacterial infection, unspecified: Secondary | ICD-10-CM

## 2014-11-27 LAB — CERVICOVAGINAL ANCILLARY ONLY: WET PREP (BD AFFIRM): POSITIVE — AB

## 2014-11-27 MED ORDER — CLONIDINE HCL 0.1 MG PO TABS
0.1000 mg | ORAL_TABLET | Freq: Once | ORAL | Status: AC
  Administered 2014-11-27: 0.1 mg via ORAL

## 2014-11-27 MED ORDER — LISINOPRIL-HYDROCHLOROTHIAZIDE 20-25 MG PO TABS: 1.0000 | ORAL_TABLET | Freq: Every day | ORAL | Status: DC

## 2014-11-27 MED ORDER — LISINOPRIL-HYDROCHLOROTHIAZIDE 20-12.5 MG PO TABS: 2.0000 | ORAL_TABLET | Freq: Every day | ORAL | Status: DC

## 2014-11-27 NOTE — Assessment & Plan Note (Signed)
Pap: pap done of vaginal cuff. You will be called with results

## 2014-11-27 NOTE — Patient Instructions (Addendum)
Ms. Gilles,  1. HTN;  BP goal is < 140/90 Continue prinzide 20-25 mg once daily in AM  Continue norvasc 5 mg daily in PM  Continue toprol XL 200 mg daily in AM  2. Pap: pap done of vaginal cuff. You will be called with results   F/u in 2 weeks for BP check with RN F/u with Dr. Annitta Needs in 2 months   Dr. Adrian Blackwater

## 2014-11-27 NOTE — Assessment & Plan Note (Signed)
A: HTN: elevated BP above goal BP goal is < 140/90 P:  Continue prinzide 20-25 mg once daily in AM  Continue norvasc 5 mg daily in PM  Continue toprol XL 200 mg daily in AM

## 2014-11-27 NOTE — Progress Notes (Signed)
   Subjective:    Patient ID: Janice Deleon, female    DOB: 1956/03/06, 59 y.o.   MRN: [id number] CC: here for pap PCP:Dr. Advani  HPI  1. Pap: patient has hx of cervical cancer dx in 2000. She is s/p radiation therapy and partial hysterectomy. She has no vaginal bleeding. She has some discharge and mild itching. No pelvic pain.   2. HTN: did not take medications yet today. Plans to take them this PM with dinner. No HA, CP, SOB.   3. HM: normal mammogram 06/27/2014. No breast pains, lumps or nipple discharge.   Soc Hx: non smoker  Review of Systems  Genitourinary: Positive for vaginal discharge. Negative for vaginal bleeding and vaginal pain.       Objective:   Physical Exam BP 179/100 mmHg  Pulse 66  Temp(Src) 98.9 F (37.2 C) (Oral)  Resp 16  Ht 5\' 3"  (1.6 m)  Wt 140 lb (63.504 kg)  BMI 24.81 kg/m2  SpO2 99% General appearance: alert, cooperative and no distress Lungs: normal WOB  Pelvic: external genitalia normal, no adnexal masses or tenderness, positive findings: vaginal discharge:  white and thick or cervix and uterus surgically absent  and rectovaginal septum normal   BP 170/100 at 1600: 0.1 clonidine given Repeat BP 1626: 160/92, HR 73     Assessment & Plan:

## 2014-11-28 LAB — CYTOLOGY - PAP

## 2014-11-29 DIAGNOSIS — N760 Acute vaginitis: Secondary | ICD-10-CM

## 2014-11-29 DIAGNOSIS — A599 Trichomoniasis, unspecified: Secondary | ICD-10-CM | POA: Insufficient documentation

## 2014-11-29 DIAGNOSIS — B9689 Other specified bacterial agents as the cause of diseases classified elsewhere: Secondary | ICD-10-CM | POA: Insufficient documentation

## 2014-11-29 DIAGNOSIS — Z114 Encounter for screening for human immunodeficiency virus [HIV]: Secondary | ICD-10-CM | POA: Insufficient documentation

## 2014-11-29 MED ORDER — FLUCONAZOLE 150 MG PO TABS: 150.0000 mg | ORAL_TABLET | Freq: Once | ORAL | Status: DC

## 2014-11-29 MED ORDER — METRONIDAZOLE 500 MG PO TABS: 500.0000 mg | ORAL_TABLET | Freq: Two times a day (BID) | ORAL | Status: DC

## 2014-11-29 NOTE — Assessment & Plan Note (Signed)
A: trich on wet prep P: treat with flagyl

## 2014-11-29 NOTE — Addendum Note (Signed)
Addended by: Boykin Nearing on: 11/29/2014 09:28 AM   Modules accepted: Orders

## 2014-11-29 NOTE — Assessment & Plan Note (Signed)
A: BV on wet prep P: treat with flagyl followed by diflucan

## 2014-12-05 ENCOUNTER — Telehealth: Payer: Self-pay | Admitting: *Deleted

## 2014-12-05 NOTE — Telephone Encounter (Signed)
-----   Message from Boykin Nearing, MD sent at 11/29/2014  9:25 AM EDT ----- BV and trich on wet prep, will treat

## 2014-12-05 NOTE — Telephone Encounter (Signed)
-----   Message from Boykin Nearing, MD sent at 11/29/2014  5:10 PM EDT ----- HPV positive, normal cells (cytology).  Repeat in one year recommended

## 2014-12-05 NOTE — Telephone Encounter (Signed)
-----   Message from Boykin Nearing, MD sent at 11/29/2014  9:28 AM EDT ----- Patient also to come in for urine Gc/chlam order placed

## 2014-12-05 NOTE — Telephone Encounter (Signed)
Unable to contact Pt Phone number is unavailable at this moment

## 2014-12-06 NOTE — Telephone Encounter (Signed)
Unable to contact pt. 

## 2014-12-10 ENCOUNTER — Encounter (HOSPITAL_COMMUNITY): Payer: Self-pay | Admitting: Emergency Medicine

## 2014-12-10 ENCOUNTER — Emergency Department (HOSPITAL_COMMUNITY)
Admission: EM | Admit: 2014-12-10 | Discharge: 2014-12-10 | Disposition: A | Discharge status: Home / Self Care | Payer: Medicare Other | Attending: Emergency Medicine | Admitting: Emergency Medicine

## 2014-12-10 ENCOUNTER — Emergency Department (HOSPITAL_COMMUNITY): Payer: Medicare Other

## 2014-12-10 DIAGNOSIS — K219 Gastro-esophageal reflux disease without esophagitis: Secondary | ICD-10-CM | POA: Insufficient documentation

## 2014-12-10 DIAGNOSIS — K5900 Constipation, unspecified: Secondary | ICD-10-CM | POA: Diagnosis not present

## 2014-12-10 DIAGNOSIS — R112 Nausea with vomiting, unspecified: Secondary | ICD-10-CM | POA: Diagnosis not present

## 2014-12-10 DIAGNOSIS — K2970 Gastritis, unspecified, without bleeding: Secondary | ICD-10-CM | POA: Diagnosis not present

## 2014-12-10 DIAGNOSIS — I10 Essential (primary) hypertension: Secondary | ICD-10-CM | POA: Diagnosis not present

## 2014-12-10 DIAGNOSIS — R109 Unspecified abdominal pain: Secondary | ICD-10-CM | POA: Diagnosis present

## 2014-12-10 DIAGNOSIS — N390 Urinary tract infection, site not specified: Secondary | ICD-10-CM

## 2014-12-10 DIAGNOSIS — Z86718 Personal history of other venous thrombosis and embolism: Secondary | ICD-10-CM | POA: Insufficient documentation

## 2014-12-10 DIAGNOSIS — Z792 Long term (current) use of antibiotics: Secondary | ICD-10-CM | POA: Diagnosis not present

## 2014-12-10 DIAGNOSIS — R55 Syncope and collapse: Secondary | ICD-10-CM | POA: Diagnosis not present

## 2014-12-10 DIAGNOSIS — K6389 Other specified diseases of intestine: Secondary | ICD-10-CM | POA: Diagnosis not present

## 2014-12-10 DIAGNOSIS — R630 Anorexia: Secondary | ICD-10-CM | POA: Diagnosis not present

## 2014-12-10 DIAGNOSIS — Z79899 Other long term (current) drug therapy: Secondary | ICD-10-CM | POA: Insufficient documentation

## 2014-12-10 DIAGNOSIS — Z9889 Other specified postprocedural states: Secondary | ICD-10-CM | POA: Diagnosis not present

## 2014-12-10 DIAGNOSIS — R1030 Lower abdominal pain, unspecified: Secondary | ICD-10-CM | POA: Diagnosis not present

## 2014-12-10 LAB — COMPREHENSIVE METABOLIC PANEL
ALT: 13 U/L (ref 0–35)
AST: 16 U/L (ref 0–37)
Albumin: 4 g/dL (ref 3.5–5.2)
Alkaline Phosphatase: 80 U/L (ref 39–117)
Anion gap: 11 (ref 5–15)
BILIRUBIN TOTAL: 0.7 mg/dL (ref 0.3–1.2)
BUN: 17 mg/dL (ref 6–23)
CHLORIDE: 93 mmol/L — AB (ref 96–112)
CO2: 28 mmol/L (ref 19–32)
Calcium: 9.1 mg/dL (ref 8.4–10.5)
Creatinine, Ser: 0.89 mg/dL (ref 0.50–1.10)
GFR calc Af Amer: 82 mL/min — ABNORMAL LOW (ref >90)
GFR, EST NON AFRICAN AMERICAN: 71 mL/min — AB (ref >90)
Glucose, Bld: 124 mg/dL — ABNORMAL HIGH (ref 70–99)
POTASSIUM: 3.9 mmol/L (ref 3.5–5.1)
Sodium: 132 mmol/L — ABNORMAL LOW (ref 135–145)
Total Protein: 8.7 g/dL — ABNORMAL HIGH (ref 6.0–8.3)

## 2014-12-10 LAB — URINE MICROSCOPIC-ADD ON

## 2014-12-10 LAB — URINALYSIS, ROUTINE W REFLEX MICROSCOPIC
BILIRUBIN URINE: NEGATIVE
GLUCOSE, UA: NEGATIVE mg/dL
KETONES UR: 15 mg/dL — AB
Nitrite: POSITIVE — AB
PH: 6 (ref 5.0–8.0)
PROTEIN: 30 mg/dL — AB
Specific Gravity, Urine: 1.02 (ref 1.005–1.030)
Urobilinogen, UA: 0.2 mg/dL (ref 0.0–1.0)

## 2014-12-10 LAB — CBC WITH DIFFERENTIAL/PLATELET
Basophils Absolute: 0 10*3/uL (ref 0.0–0.1)
Basophils Relative: 0 % (ref 0–1)
Eosinophils Absolute: 0.1 10*3/uL (ref 0.0–0.7)
Eosinophils Relative: 1 % (ref 0–5)
HEMATOCRIT: 39.2 % (ref 36.0–46.0)
Hemoglobin: 13.2 g/dL (ref 12.0–15.0)
LYMPHS PCT: 14 % (ref 12–46)
Lymphs Abs: 1.6 10*3/uL (ref 0.7–4.0)
MCH: 31.4 pg (ref 26.0–34.0)
MCHC: 33.7 g/dL (ref 30.0–36.0)
MCV: 93.1 fL (ref 78.0–100.0)
MONO ABS: 0.9 10*3/uL (ref 0.1–1.0)
Monocytes Relative: 8 % (ref 3–12)
NEUTROS ABS: 8.8 10*3/uL — AB (ref 1.7–7.7)
NEUTROS PCT: 77 % (ref 43–77)
Platelets: 350 10*3/uL (ref 150–400)
RBC: 4.21 MIL/uL (ref 3.87–5.11)
RDW: 13.2 % (ref 11.5–15.5)
WBC: 11.4 10*3/uL — AB (ref 4.0–10.5)

## 2014-12-10 MED ORDER — ONDANSETRON 4 MG PO TBDP: ORAL_TABLET | ORAL | Status: DC

## 2014-12-10 MED ORDER — CEFTRIAXONE SODIUM 1 G IJ SOLR
1.0000 g | Freq: Once | INTRAMUSCULAR | Status: AC
  Administered 2014-12-10: 1 g via INTRAVENOUS
  Filled 2014-12-10: qty 10

## 2014-12-10 MED ORDER — SULFAMETHOXAZOLE-TRIMETHOPRIM 800-160 MG PO TABS: 1.0000 | ORAL_TABLET | Freq: Two times a day (BID) | ORAL | Status: DC

## 2014-12-10 MED ORDER — DIPHENHYDRAMINE HCL 50 MG/ML IJ SOLN
25.0000 mg | Freq: Once | INTRAMUSCULAR | Status: AC
  Administered 2014-12-10: 25 mg via INTRAVENOUS

## 2014-12-10 MED ORDER — MORPHINE SULFATE 4 MG/ML IJ SOLN
4.0000 mg | Freq: Once | INTRAMUSCULAR | Status: AC
  Administered 2014-12-10: 4 mg via INTRAVENOUS
  Filled 2014-12-10: qty 1

## 2014-12-10 MED ORDER — DIPHENHYDRAMINE HCL 50 MG/ML IJ SOLN
INTRAMUSCULAR | Status: AC
  Filled 2014-12-10: qty 1

## 2014-12-10 MED ORDER — SODIUM CHLORIDE 0.9 % IV BOLUS (SEPSIS)
500.0000 mL | Freq: Once | INTRAVENOUS | Status: AC
  Administered 2014-12-10: 500 mL via INTRAVENOUS

## 2014-12-10 NOTE — ED Notes (Signed)
Gave pt sprite

## 2014-12-10 NOTE — ED Notes (Signed)
Zavitz at bedside.

## 2014-12-10 NOTE — ED Notes (Signed)
Pt has raised welt above IV start post morphine administration/returning from CT. Pt denies other SOB, CP, or other symptoms. Zavitz notified.

## 2014-12-10 NOTE — Discharge Instructions (Signed)
If you were given medicines take as directed.  If you are on coumadin or contraceptives realize their levels and effectiveness is altered by many different medicines.  If you have any reaction (rash, tongues swelling, other) to the medicines stop taking and see a physician.   Please follow up as directed and return to the ER or see a physician for new or worsening symptoms.  Thank you. Filed Vitals:   12/10/14 0909 12/10/14 1022 12/10/14 1146  BP: 145/92 143/79 151/91  Pulse: 75 80 79  Temp: 97.4 F (36.3 C)    TempSrc: Oral    Resp: 14 16 18   SpO2: 97% 98% 98%

## 2014-12-10 NOTE — ED Provider Notes (Signed)
CSN: [id number]     Arrival date & time 12/10/14  0909 History   First MD Initiated Contact with Patient 12/10/14 [phone]     Chief Complaint  Patient presents with  . Emesis     (Consider location/radiation/quality/duration/timing/severity/associated sxs/prior Treatment) HPI Comments: 59 year old female with lipids, high blood pressure, cocaine abuse, nephrectomy presents with intermittent lower abdominal discomfort and vomiting for the past 4 days. This started after drinking milk which she does not drink often and thinks this is related. Sharp and cramping component. Patient is passing gas and had normal bowel movement. History of bowel obstruction, this feels different. No fevers or chills, no sick contacts no recent antibiotics. Nothing has improved her symptoms except time.  Patient is a 59 y.o. female presenting with vomiting. The history is provided by the patient.  Emesis Associated symptoms: abdominal pain   Associated symptoms: no chills and no headaches     Past Medical History  Diagnosis Date  . Hypertension   . GERD (gastroesophageal reflux disease)    Past Surgical History  Procedure Laterality Date  . Abdominal hysterectomy    . Right nephrectomy     . Colonscopy       at the age of 19   Family History  Problem Relation Age of Onset  . Hypertension Mother   . Diabetes Mother   . Heart disease Mother   . Hypertension Father   . Cancer Father     colon cancer   . Hypertension Sister   . Diabetes Sister   . Hypertension Brother   . Diabetes Brother   . Stroke Maternal Aunt    History  Substance Use Topics  . Smoking status: Never Smoker   . Smokeless tobacco: Not on file  . Alcohol Use: No   OB History    No data available     Review of Systems  Constitutional: Positive for appetite change. Negative for fever and chills.  HENT: Negative for congestion.   Eyes: Negative for visual disturbance.  Respiratory: Negative for shortness of breath.    Cardiovascular: Negative for chest pain.  Gastrointestinal: Positive for nausea, vomiting and abdominal pain.  Genitourinary: Negative for dysuria and flank pain.  Musculoskeletal: Negative for back pain, neck pain and neck stiffness.  Skin: Negative for rash.  Neurological: Negative for light-headedness and headaches.      Allergies  Hydrocodone-acetaminophen  Home Medications   Prior to Admission medications   Medication Sig Start Date End Date Taking? Authorizing Provider  amLODipine (NORVASC) 5 MG tablet Take 1 tablet (5 mg total) by mouth daily. 10/23/14  Yes Lorayne Marek, MD  lisinopril-hydrochlorothiazide (PRINZIDE,ZESTORETIC) 20-25 MG per tablet Take 1 tablet by mouth daily. 11/27/14  Yes Josalyn Funches, MD  metoprolol (TOPROL-XL) 200 MG 24 hr tablet Take 1 tablet (200 mg total) by mouth daily. 10/23/14  Yes Lorayne Marek, MD  fluconazole (DIFLUCAN) 150 MG tablet Take 1 tablet (150 mg total) by mouth once. 11/29/14   Josalyn Funches, MD  metroNIDAZOLE (FLAGYL) 500 MG tablet Take 1 tablet (500 mg total) by mouth 2 (two) times daily. 11/29/14   Josalyn Funches, MD  omeprazole (PRILOSEC) 20 MG capsule Take 1 capsule (20 mg total) by mouth daily. 10/23/14   Lorayne Marek, MD  ondansetron (ZOFRAN ODT) 4 MG disintegrating tablet 4mg  ODT q4 hours prn nausea/vomit 12/10/14   Elnora Morrison, MD  sulfamethoxazole-trimethoprim (SEPTRA DS) 800-160 MG per tablet Take 1 tablet by mouth 2 (two) times daily. 12/10/14   Elnora Morrison,  MD  traMADol (ULTRAM) 50 MG tablet Take 50 mg by mouth every 6 (six) hours as needed for moderate pain.     Historical Provider, MD  Vitamin D, Ergocalciferol, (DRISDOL) 50000 UNITS CAPS capsule Take 1 capsule (50,000 Units total) by mouth every 7 (seven) days. 05/21/14   Lorayne Marek, MD   BP 145/86 mmHg  Pulse 88  Temp(Src) 97.4 F (36.3 C) (Oral)  Resp 18  SpO2 100%  LMP 08/31/1998 Physical Exam  Constitutional: She is oriented to person, place, and time. She  appears well-developed and well-nourished.  HENT:  Head: Normocephalic and atraumatic.  Mild dry mucous membranes  Eyes: Conjunctivae are normal. Right eye exhibits no discharge. Left eye exhibits no discharge.  Neck: Normal range of motion. Neck supple. No tracheal deviation present.  Cardiovascular: Normal rate and regular rhythm.   Pulmonary/Chest: Effort normal and breath sounds normal.  Abdominal: Soft. She exhibits no distension. There is tenderness (lower abdomen). There is no guarding.  Musculoskeletal: She exhibits no edema.  Neurological: She is alert and oriented to person, place, and time.  Skin: Skin is warm. No rash noted.  Psychiatric: She has a normal mood and affect.  Nursing note and vitals reviewed.   ED Course  Procedures (including critical care time) Labs Review Labs Reviewed  COMPREHENSIVE METABOLIC PANEL - Abnormal; Notable for the following:    Sodium 132 (*)    Chloride 93 (*)    Glucose, Bld 124 (*)    Total Protein 8.7 (*)    GFR calc non Af Amer 71 (*)    GFR calc Af Amer 82 (*)    All other components within normal limits  CBC WITH DIFFERENTIAL/PLATELET - Abnormal; Notable for the following:    WBC 11.4 (*)    Neutro Abs 8.8 (*)    All other components within normal limits  URINALYSIS, ROUTINE W REFLEX MICROSCOPIC - Abnormal; Notable for the following:    APPearance CLOUDY (*)    Hgb urine dipstick MODERATE (*)    Ketones, ur 15 (*)    Protein, ur 30 (*)    Nitrite POSITIVE (*)    Leukocytes, UA LARGE (*)    All other components within normal limits  URINE MICROSCOPIC-ADD ON - Abnormal; Notable for the following:    Bacteria, UA MANY (*)    Casts HYALINE CASTS (*)    All other components within normal limits    Imaging Review Ct Renal Stone Study  12/10/2014   CLINICAL DATA:  Right site abdominal pain, constipation, status post right nephrectomy, hysterectomy  EXAM: CT ABDOMEN AND PELVIS WITHOUT CONTRAST  TECHNIQUE: Multidetector CT  imaging of the abdomen and pelvis was performed following the standard protocol without IV contrast.  COMPARISON:  None.  FINDINGS: The lung bases are unremarkable. Sagittal images of the spine are unremarkable. The study is limited without IV or oral contrast. Small anterior pericardial effusion measures 6 mm thickness.  Unenhanced liver shows no biliary ductal dilatation. No calcified gallstones are noted within gallbladder. Unenhanced pancreas, spleen and adrenal glands are unremarkable. Right kidney surgically absent. Mild atherosclerotic calcifications of abdominal aorta. No aortic aneurysm.  Mild fluid distended small bowel loops in lower pelvis. Mild ileus or enteritis cannot be excluded. No transition point in caliber of small bowel. Liquid stool noted in right colon. The terminal ileum is unremarkable. The appendix is not identified.  The right kidney surgically absent. No left nephrolithiasis. No left ureteral calculi. No left hydronephrosis or hydroureter. No calcified  calculi are noted within empty urinary bladder. No inguinal adenopathy. No destructive bony lesions are noted within pelvis.  IMPRESSION: 1. Limited exam without IV and oral contrast. 2. Surgically absent right kidney. 3. No left nephrolithiasis.  No hydronephrosis or hydroureter. 4. Mild fluid distended small bowel loops in lower pelvis suspicious for mild ileus or enteritis. No transition point in caliber of small bowel. 5. Some liquid stool noted in right colon. 6. Limited assessment of urinary bladder which is empty. 7. No pericecal inflammation.  The appendix is not identified.   Electronically Signed   By: Lahoma Crocker M.D.   On: 12/10/2014 10:35     EKG Interpretation None      MDM   Final diagnoses:  Right sided abdominal pain  UTI (lower urinary tract infection)  Non-intractable vomiting with nausea, vomiting of unspecified type   Patient presents with intermittent vomiting and abdominal pain lower abdomen for the past  4 days. Discussed differential diagnosis including colitis, enteritis, atypical appendicitis, sensitivity to lactose or other foods. Plan for IV fluids, CT without contrast due to single kidney. CT scan results reviewed no acute findings showed mild enteritis. No specific appendix visualized.  Patient improved in the ER. Urine infected. Antibiotics given. Discussed close outpatient follow-up with oral antibiotics.  Results and differential diagnosis were discussed with the patient/parent/guardian. Close follow up outpatient was discussed, comfortable with the plan.   Medications  sodium chloride 0.9 % bolus 500 mL (0 mLs Intravenous Stopped 12/10/14 1022)  morphine 4 MG/ML injection 4 mg (4 mg Intravenous Given 12/10/14 0951)  diphenhydrAMINE (BENADRYL) injection 25 mg (25 mg Intravenous Given 12/10/14 1022)  cefTRIAXone (ROCEPHIN) 1 g in dextrose 5 % 50 mL IVPB (0 g Intravenous Stopped 12/10/14 1312)    Filed Vitals:   12/10/14 0909 12/10/14 1022 12/10/14 1146 12/10/14 1328  BP: 145/92 143/79 151/91 145/86  Pulse: 75 80 79 88  Temp: 97.4 F (36.3 C)     TempSrc: Oral     Resp: 14 16 18 18   SpO2: 97% 98% 98% 100%    Final diagnoses:  Right sided abdominal pain  UTI (lower urinary tract infection)  Non-intractable vomiting with nausea, vomiting of unspecified type       Elnora Morrison, MD 12/10/14 1421

## 2014-12-10 NOTE — ED Notes (Signed)
Bed: WA23 Expected date:  Expected time:  Means of arrival:  Comments: NV 

## 2014-12-10 NOTE — ED Notes (Signed)
Pt transported to CT ?

## 2014-12-10 NOTE — ED Notes (Signed)
Per EMS pt complaint of generalized abdominal pain for 3 days with emesis. Pt given 4 mg zofran IV en route.

## 2014-12-10 NOTE — ED Notes (Signed)
Pt given additional sprite per request.

## 2015-05-07 ENCOUNTER — Ambulatory Visit: Payer: Medicare Other | Admitting: Family Medicine

## 2016-08-03 DIAGNOSIS — Z905 Acquired absence of kidney: Secondary | ICD-10-CM | POA: Diagnosis not present

## 2016-08-03 DIAGNOSIS — C539 Malignant neoplasm of cervix uteri, unspecified: Secondary | ICD-10-CM | POA: Diagnosis not present

## 2016-08-03 DIAGNOSIS — Z7689 Persons encountering health services in other specified circumstances: Secondary | ICD-10-CM | POA: Diagnosis not present

## 2016-08-03 DIAGNOSIS — Z1231 Encounter for screening mammogram for malignant neoplasm of breast: Secondary | ICD-10-CM | POA: Diagnosis not present

## 2016-10-14 ENCOUNTER — Other Ambulatory Visit: Payer: Self-pay | Admitting: Primary Care

## 2016-10-14 DIAGNOSIS — Z1231 Encounter for screening mammogram for malignant neoplasm of breast: Secondary | ICD-10-CM

## 2016-11-02 ENCOUNTER — Ambulatory Visit: Payer: Medicare Other

## 2017-02-02 ENCOUNTER — Encounter: Payer: Self-pay | Admitting: Primary Care

## 2017-04-02 ENCOUNTER — Other Ambulatory Visit: Payer: Self-pay | Admitting: Osteopathic Medicine

## 2017-04-02 DIAGNOSIS — Z1231 Encounter for screening mammogram for malignant neoplasm of breast: Secondary | ICD-10-CM

## 2017-04-16 ENCOUNTER — Ambulatory Visit: Payer: Medicare Other

## 2017-05-05 ENCOUNTER — Ambulatory Visit
Admission: RE | Admit: 2017-05-05 | Discharge: 2017-05-05 | Disposition: A | Discharge status: Home / Self Care | Payer: Medicare Other | Source: Ambulatory Visit | Attending: Osteopathic Medicine | Admitting: Osteopathic Medicine

## 2017-05-05 DIAGNOSIS — Z1231 Encounter for screening mammogram for malignant neoplasm of breast: Secondary | ICD-10-CM

## 2017-07-12 ENCOUNTER — Emergency Department (HOSPITAL_COMMUNITY): Payer: Medicare Other

## 2017-07-12 ENCOUNTER — Emergency Department (HOSPITAL_COMMUNITY)
Admission: EM | Admit: 2017-07-12 | Discharge: 2017-07-12 | Disposition: A | Discharge status: Home / Self Care | Payer: Medicare Other | Attending: Emergency Medicine | Admitting: Emergency Medicine

## 2017-07-12 DIAGNOSIS — R1012 Left upper quadrant pain: Secondary | ICD-10-CM | POA: Diagnosis not present

## 2017-07-12 DIAGNOSIS — I10 Essential (primary) hypertension: Secondary | ICD-10-CM | POA: Insufficient documentation

## 2017-07-12 DIAGNOSIS — R109 Unspecified abdominal pain: Secondary | ICD-10-CM | POA: Diagnosis not present

## 2017-07-12 DIAGNOSIS — R197 Diarrhea, unspecified: Secondary | ICD-10-CM | POA: Diagnosis not present

## 2017-07-12 DIAGNOSIS — Z79899 Other long term (current) drug therapy: Secondary | ICD-10-CM | POA: Insufficient documentation

## 2017-07-12 DIAGNOSIS — R1011 Right upper quadrant pain: Secondary | ICD-10-CM | POA: Diagnosis not present

## 2017-07-12 DIAGNOSIS — N390 Urinary tract infection, site not specified: Secondary | ICD-10-CM

## 2017-07-12 LAB — CBC WITH DIFFERENTIAL/PLATELET
BASOS ABS: 0 10*3/uL (ref 0.0–0.1)
BASOS PCT: 1 %
EOS ABS: 0.1 10*3/uL (ref 0.0–0.7)
Eosinophils Relative: 3 %
HEMATOCRIT: 37.2 % (ref 36.0–46.0)
HEMOGLOBIN: 12.5 g/dL (ref 12.0–15.0)
LYMPHS ABS: 2.7 10*3/uL (ref 0.7–4.0)
Lymphocytes Relative: 62 %
MCH: 31.2 pg (ref 26.0–34.0)
MCHC: 33.6 g/dL (ref 30.0–36.0)
MCV: 92.8 fL (ref 78.0–100.0)
Monocytes Absolute: 0.3 10*3/uL (ref 0.1–1.0)
Monocytes Relative: 7 %
NEUTROS ABS: 1.2 10*3/uL — AB (ref 1.7–7.7)
NEUTROS PCT: 27 %
Platelets: 344 10*3/uL (ref 150–400)
RBC: 4.01 MIL/uL (ref 3.87–5.11)
RDW: 13.6 % (ref 11.5–15.5)
WBC: 4.4 10*3/uL (ref 4.0–10.5)

## 2017-07-12 LAB — COMPREHENSIVE METABOLIC PANEL
ALK PHOS: 64 U/L (ref 38–126)
ALT: 15 U/L (ref 14–54)
AST: 16 U/L (ref 15–41)
Albumin: 4 g/dL (ref 3.5–5.0)
Anion gap: 5 (ref 5–15)
BUN: 14 mg/dL (ref 6–20)
CALCIUM: 9.2 mg/dL (ref 8.9–10.3)
CO2: 24 mmol/L (ref 22–32)
CREATININE: 0.75 mg/dL (ref 0.44–1.00)
Chloride: 108 mmol/L (ref 101–111)
Glucose, Bld: 89 mg/dL (ref 65–99)
Potassium: 4.2 mmol/L (ref 3.5–5.1)
Sodium: 137 mmol/L (ref 135–145)
Total Bilirubin: 0.6 mg/dL (ref 0.3–1.2)
Total Protein: 7.6 g/dL (ref 6.5–8.1)

## 2017-07-12 LAB — URINALYSIS, ROUTINE W REFLEX MICROSCOPIC
Bilirubin Urine: NEGATIVE
GLUCOSE, UA: NEGATIVE mg/dL
Hgb urine dipstick: NEGATIVE
KETONES UR: 5 mg/dL — AB
Nitrite: NEGATIVE
PROTEIN: 30 mg/dL — AB
Specific Gravity, Urine: 1.034 — ABNORMAL HIGH (ref 1.005–1.030)
pH: 5 (ref 5.0–8.0)

## 2017-07-12 LAB — LIPASE, BLOOD: LIPASE: 23 U/L (ref 11–51)

## 2017-07-12 MED ORDER — CEPHALEXIN 500 MG PO CAPS: 500.0000 mg | ORAL_CAPSULE | Freq: Three times a day (TID) | ORAL | 0 refills | Status: AC

## 2017-07-12 MED ORDER — IOPAMIDOL (ISOVUE-300) INJECTION 61%
INTRAVENOUS | Status: AC
  Filled 2017-07-12: qty 100

## 2017-07-12 MED ORDER — ONDANSETRON 4 MG PO TBDP: 4.0000 mg | ORAL_TABLET | Freq: Three times a day (TID) | ORAL | 0 refills | Status: DC | PRN

## 2017-07-12 MED ORDER — ONDANSETRON HCL 4 MG/2ML IJ SOLN
4.0000 mg | Freq: Once | INTRAMUSCULAR | Status: AC
  Administered 2017-07-12: 4 mg via INTRAVENOUS
  Filled 2017-07-12: qty 2

## 2017-07-12 MED ORDER — DICYCLOMINE HCL 20 MG PO TABS: 20.0000 mg | ORAL_TABLET | Freq: Two times a day (BID) | ORAL | 0 refills | Status: DC | PRN

## 2017-07-12 MED ORDER — MORPHINE SULFATE (PF) 4 MG/ML IV SOLN
4.0000 mg | Freq: Once | INTRAVENOUS | Status: AC
Start: 2017-07-12 — End: 2017-07-12
  Administered 2017-07-12: 4 mg via INTRAVENOUS
  Filled 2017-07-12: qty 1

## 2017-07-12 MED ORDER — SODIUM CHLORIDE 0.9 % IV BOLUS (SEPSIS)
1000.0000 mL | Freq: Once | INTRAVENOUS | Status: AC
  Administered 2017-07-12: 1000 mL via INTRAVENOUS

## 2017-07-12 MED ORDER — DEXTROSE 5 % IV SOLN
1.0000 g | Freq: Once | INTRAVENOUS | Status: AC
  Administered 2017-07-12: 1 g via INTRAVENOUS
  Filled 2017-07-12: qty 10

## 2017-07-12 MED ORDER — IOPAMIDOL (ISOVUE-300) INJECTION 61%
100.0000 mL | Freq: Once | INTRAVENOUS | Status: AC | PRN
  Administered 2017-07-12: 80 mL via INTRAVENOUS

## 2017-07-12 NOTE — ED Notes (Signed)
MD at bedside. 

## 2017-07-12 NOTE — ED Triage Notes (Signed)
Pt reports having upper abdominal pain that started 3-4 days ago. Pt reports having pain with urinating. Pt denies n/v. Pt states she has had loose stools the last two mornings. Pt has a hx of HTN and two bowel obstructions. Pt AO x4.

## 2017-07-12 NOTE — ED Provider Notes (Signed)
[facility] DEPT Provider Note   CSN: [id number] Arrival date & time: 07/12/17  1761     History   Chief Complaint Chief Complaint  Patient presents with  . Abdominal Pain    HPI Janice Deleon is a 61 y.o. female.  HPI   61 year old female with a history of hypertension presents with concern for abdominal pain.  Reports abdominal pain began about 3 days ago, is located in the epigastrium with radiation to the suprapubic area.  Reports she has had nausea but no vomiting.  Reports she has had loose stool twice a day in the mornings for the past 3 days.  Reports the pain worsens with urination.  Denies vaginal bleeding or discharge.  Denies pain worsening with eating.  Denies fevers.  Reports history of prior bowel obstructions and this feels different.  Reports pain is 9 out of 10.  She is unable to state the quality of the pain, but is reports that it is severe pain.  Reports she has a history of pain from scar tissue from her prior abdominal surgeries, however this pain feels different.   Past Medical History:  Diagnosis Date  . GERD (gastroesophageal reflux disease)   . Hypertension     Patient Active Problem List   Diagnosis Date Noted  . BV (bacterial vaginosis) 11/29/2014  . Trichimoniasis 11/29/2014  . Vitamin D deficiency 10/23/2014  . Esophageal reflux 05/18/2014  . HYPERLIPIDEMIA 07/19/2006  . DEPRESSION 07/19/2006  . Essential hypertension 07/19/2006  . RADIATION PROCTITIS 07/19/2006  . ABDOMINAL PAIN 07/19/2006  . CERVICAL CANCER, HX OF 07/19/2006  . COCAINE ABUSE, HX OF 07/19/2006  . NEPHRECTOMY, HX OF 07/19/2006    Past Surgical History:  Procedure Laterality Date  . ABDOMINAL HYSTERECTOMY    . colonscopy      at the age of 38  . right nephrectomy       OB History    No data available       Home Medications    Prior to Admission medications   Medication Sig Start Date End Date Taking? Authorizing Provider    acetaminophen (TYLENOL) 500 MG tablet Take 1,500 mg every 4 (four) hours as needed by mouth for mild pain, moderate pain, fever or headache.   Yes [provider]  ferrous sulfate 325 (65 FE) MG tablet Take 325 mg daily by mouth.   Yes [provider]  Vitamin D, Ergocalciferol, (DRISDOL) 50000 UNITS CAPS capsule Take 1 capsule (50,000 Units total) by mouth every 7 (seven) days. Patient taking differently: Take 50,000 Units every Wednesday by mouth.  05/21/14  Yes Advani, Deepak, MD  amLODipine (NORVASC) 5 MG tablet Take 1 tablet (5 mg total) by mouth daily. 10/23/14   Lorayne Marek, MD  cephALEXin (KEFLEX) 500 MG capsule Take 1 capsule (500 mg total) 3 (three) times daily for 10 days by mouth. 07/12/17 07/22/17  Gareth Morgan, MD  dicyclomine (BENTYL) 20 MG tablet Take 1 tablet (20 mg total) 2 (two) times daily as needed by mouth (abdominal pain). 07/12/17   Gareth Morgan, MD  fluconazole (DIFLUCAN) 150 MG tablet Take 1 tablet (150 mg total) by mouth once. 11/29/14   Funches, Adriana Mccallum, MD  lisinopril-hydrochlorothiazide (PRINZIDE,ZESTORETIC) 20-25 MG per tablet Take 1 tablet by mouth daily. 11/27/14   Funches, Adriana Mccallum, MD  metoprolol (TOPROL-XL) 200 MG 24 hr tablet Take 1 tablet (200 mg total) by mouth daily. 10/23/14   Lorayne Marek, MD  metroNIDAZOLE (FLAGYL) 500 MG tablet Take 1  tablet (500 mg total) by mouth 2 (two) times daily. 11/29/14   Funches, Adriana Mccallum, MD  omeprazole (PRILOSEC) 20 MG capsule Take 1 capsule (20 mg total) by mouth daily. 10/23/14   Lorayne Marek, MD  ondansetron (ZOFRAN ODT) 4 MG disintegrating tablet Take 1 tablet (4 mg total) every 8 (eight) hours as needed by mouth for nausea or vomiting. 07/12/17   Gareth Morgan, MD  sulfamethoxazole-trimethoprim (SEPTRA DS) 800-160 MG per tablet Take 1 tablet by mouth 2 (two) times daily. 12/10/14   Elnora Morrison, MD  traMADol (ULTRAM) 50 MG tablet Take 50 mg by mouth every 6 (six) hours as needed for moderate pain.      [provider]    Family History Family History  Problem Relation Age of Onset  . Hypertension Mother   . Diabetes Mother   . Heart disease Mother   . Hypertension Father   . Cancer Father        colon cancer   . Hypertension Sister   . Diabetes Sister   . Hypertension Brother   . Diabetes Brother   . Stroke Maternal Aunt     Social History Social History   Tobacco Use  . Smoking status: Never Smoker  Substance Use Topics  . Alcohol use: No  . Drug use: No     Allergies   Hydrocodone-acetaminophen   Review of Systems Review of Systems  Constitutional: Negative for fever.  HENT: Negative for sore throat.   Eyes: Negative for visual disturbance.  Respiratory: Negative for cough and shortness of breath.   Cardiovascular: Negative for chest pain.  Gastrointestinal: Positive for abdominal pain, diarrhea and nausea. Negative for constipation and vomiting.  Genitourinary: Negative for difficulty urinating, dysuria, vaginal bleeding and vaginal discharge.  Musculoskeletal: Negative for back pain and neck pain.  Skin: Negative for rash.  Neurological: Negative for syncope and headaches.     Physical Exam Updated Vital Signs BP (!) 176/99 (BP Location: Left Arm)   Pulse 61   Temp 98.3 F (36.8 C) (Oral)   Resp 16   LMP 08/31/1998   SpO2 100%   Physical Exam  Constitutional: She is oriented to person, place, and time. She appears well-developed and well-nourished. No distress.  HENT:  Head: Normocephalic and atraumatic.  Eyes: Conjunctivae and EOM are normal.  Neck: Normal range of motion.  Cardiovascular: Normal rate, regular rhythm, normal heart sounds and intact distal pulses. Exam reveals no gallop and no friction rub.  No murmur heard. Pulmonary/Chest: Effort normal and breath sounds normal. No respiratory distress. She has no wheezes. She has no rales.  Abdominal: Soft. She exhibits no distension. There is tenderness (epigastic, LUQ, LLQ).  There is no guarding.  Prior surgical scars  Musculoskeletal: She exhibits no edema or tenderness.  Neurological: She is alert and oriented to person, place, and time.  Skin: Skin is warm and dry. No rash noted. She is not diaphoretic. No erythema.  Nursing note and vitals reviewed.    ED Treatments / Results  Labs (all labs ordered are listed, but only abnormal results are displayed) Labs Reviewed  CBC WITH DIFFERENTIAL/PLATELET - Abnormal; Notable for the following components:      Result Value   Neutro Abs 1.2 (*)    All other components within normal limits  URINALYSIS, ROUTINE W REFLEX MICROSCOPIC - Abnormal; Notable for the following components:   APPearance HAZY (*)    Specific Gravity, Urine 1.034 (*)    Ketones, ur 5 (*)  Protein, ur 30 (*)    Leukocytes, UA LARGE (*)    Bacteria, UA FEW (*)    Squamous Epithelial / LPF 0-5 (*)    All other components within normal limits  URINE CULTURE  COMPREHENSIVE METABOLIC PANEL  LIPASE, BLOOD    EKG  EKG Interpretation None       Radiology Ct Abdomen Pelvis W Contrast  Result Date: 07/12/2017 CLINICAL DATA:  Acute upper abdominal pain. EXAM: CT ABDOMEN AND PELVIS WITH CONTRAST TECHNIQUE: Multidetector CT imaging of the abdomen and pelvis was performed using the standard protocol following bolus administration of intravenous contrast. CONTRAST:  29mL ISOVUE-300 IOPAMIDOL (ISOVUE-300) INJECTION 61% COMPARISON:  CT scan of December 10, 2014. FINDINGS: Lower chest: No acute abnormality. Hepatobiliary: No focal liver abnormality is seen. No gallstones, gallbladder wall thickening, or biliary dilatation. Pancreas: Unremarkable. No pancreatic ductal dilatation or surrounding inflammatory changes. Spleen: Normal in size without focal abnormality. Adrenals/Urinary Tract: Adrenal glands appear normal. Status post right nephrectomy. Left kidney appears normal. No hydronephrosis or renal obstruction is noted. Urinary bladder appears  normal. No renal or ureteral calculi are noted. Stomach/Bowel: Stomach is within normal limits. Appendix appears normal. No evidence of bowel wall thickening, distention, or inflammatory changes. Vascular/Lymphatic: Aortic atherosclerosis. No enlarged abdominal or pelvic lymph nodes. Reproductive: Status post hysterectomy. No adnexal masses. Other: No abdominal wall hernia or abnormality. No abdominopelvic ascites. Musculoskeletal: No acute or significant osseous findings. IMPRESSION: Aortic atherosclerosis. No acute abnormality seen in the abdomen or pelvis. Electronically Signed   By: Marijo Conception, M.D.   On: 07/12/2017 10:17    Procedures Procedures (including critical care time)  Medications Ordered in ED Medications  sodium chloride 0.9 % bolus 1,000 mL (0 mLs Intravenous Stopped 07/12/17 1200)  morphine 4 MG/ML injection 4 mg (4 mg Intravenous Given 07/12/17 0828)  ondansetron (ZOFRAN) injection 4 mg (4 mg Intravenous Given 07/12/17 0828)  cefTRIAXone (ROCEPHIN) 1 g in dextrose 5 % 50 mL IVPB (0 g Intravenous Stopped 07/12/17 1158)  iopamidol (ISOVUE-300) 61 % injection 100 mL (80 mLs Intravenous Contrast Given 07/12/17 0947)     Initial Impression / Assessment and Plan / ED Course  I have reviewed the triage vital signs and the nursing notes.  Pertinent labs & imaging results that were available during my care of the patient were reviewed by me and considered in my medical decision making (see chart for details).      61 year old female with a history of hypertension presents with concern for abdominal pain.  CMP and lipase without significant abnormalities.  Urinalysis shows concern for UTI.  Given fluids, morphine and Zofran for pain and nausea.  Given rocephin for possible UTI.  CT abdomen pelvis shows no acute abnormalities.    Given keflex, zofran, bentyl for concern for UTI to help with nausea and abdominal pain. Patient discharged in stable condition with understanding of  reasons to return.   Final Clinical Impressions(s) / ED Diagnoses   Final diagnoses:  Urinary tract infection without hematuria, site unspecified  Abdominal pain, unspecified abdominal location    ED Discharge Orders        Ordered    cephALEXin (KEFLEX) 500 MG capsule  3 times daily     07/12/17 1109    ondansetron (ZOFRAN ODT) 4 MG disintegrating tablet  Every 8 hours PRN     07/12/17 1109    dicyclomine (BENTYL) 20 MG tablet  2 times daily PRN     07/12/17 1109  Gareth Morgan, MD 07/12/17 2103

## 2017-07-14 LAB — URINE CULTURE

## 2017-07-15 ENCOUNTER — Telehealth (HOSPITAL_BASED_OUTPATIENT_CLINIC_OR_DEPARTMENT_OTHER): Payer: Self-pay

## 2017-07-15 NOTE — Telephone Encounter (Signed)
Post ED Visit - Positive Culture Follow-up  Culture report reviewed by antimicrobial stewardship pharmacist:  []  Elenor Quinones, Pharm.D. []  Heide Guile, Pharm.D., BCPS AQ-ID []  Parks Neptune, Pharm.D., BCPS []  Alycia Rossetti, Pharm.D., BCPS []  [place], Pharm.D., BCPS, AAHIVP []  Legrand Como, Pharm.D., BCPS, AAHIVP []  Salome Arnt, PharmD, BCPS []  Dimitri Ped, PharmD, BCPS []  Vincenza Hews, PharmD, BCPS X  Charlene Brooke, RPh  Positive urine culture  Treated with Cephalexin, organism sensitive to the same and no further patient follow-up is required at this time.  OK   Dortha Kern 07/15/2017, 3:51 PM

## 2017-10-26 ENCOUNTER — Emergency Department (HOSPITAL_COMMUNITY): Payer: Medicare Other

## 2017-10-26 ENCOUNTER — Encounter (HOSPITAL_COMMUNITY): Payer: Self-pay | Admitting: Emergency Medicine

## 2017-10-26 ENCOUNTER — Emergency Department (HOSPITAL_COMMUNITY)
Admission: EM | Admit: 2017-10-26 | Discharge: 2017-10-26 | Disposition: A | Discharge status: Home / Self Care | Payer: Medicare Other | Attending: Emergency Medicine | Admitting: Emergency Medicine

## 2017-10-26 DIAGNOSIS — R079 Chest pain, unspecified: Secondary | ICD-10-CM | POA: Diagnosis not present

## 2017-10-26 DIAGNOSIS — Z79899 Other long term (current) drug therapy: Secondary | ICD-10-CM | POA: Insufficient documentation

## 2017-10-26 DIAGNOSIS — I10 Essential (primary) hypertension: Secondary | ICD-10-CM | POA: Insufficient documentation

## 2017-10-26 DIAGNOSIS — R0789 Other chest pain: Secondary | ICD-10-CM | POA: Diagnosis not present

## 2017-10-26 DIAGNOSIS — E876 Hypokalemia: Secondary | ICD-10-CM | POA: Diagnosis not present

## 2017-10-26 DIAGNOSIS — Z8541 Personal history of malignant neoplasm of cervix uteri: Secondary | ICD-10-CM | POA: Insufficient documentation

## 2017-10-26 LAB — BASIC METABOLIC PANEL
ANION GAP: 11 (ref 5–15)
BUN: 11 mg/dL (ref 6–20)
CALCIUM: 9.6 mg/dL (ref 8.9–10.3)
CO2: 25 mmol/L (ref 22–32)
Chloride: 107 mmol/L (ref 101–111)
Creatinine, Ser: 0.82 mg/dL (ref 0.44–1.00)
GLUCOSE: 72 mg/dL (ref 65–99)
POTASSIUM: 3.1 mmol/L — AB (ref 3.5–5.1)
Sodium: 143 mmol/L (ref 135–145)

## 2017-10-26 LAB — CBC
HEMATOCRIT: 39.6 % (ref 36.0–46.0)
HEMOGLOBIN: 13.7 g/dL (ref 12.0–15.0)
MCH: 31.7 pg (ref 26.0–34.0)
MCHC: 34.6 g/dL (ref 30.0–36.0)
MCV: 91.7 fL (ref 78.0–100.0)
Platelets: 396 10*3/uL (ref 150–400)
RBC: 4.32 MIL/uL (ref 3.87–5.11)
RDW: 13.5 % (ref 11.5–15.5)
WBC: 5 10*3/uL (ref 4.0–10.5)

## 2017-10-26 LAB — I-STAT TROPONIN, ED: TROPONIN I, POC: 0 ng/mL (ref 0.00–0.08)

## 2017-10-26 LAB — I-STAT BETA HCG BLOOD, ED (MC, WL, AP ONLY): HCG, QUANTITATIVE: 5.4 m[IU]/mL — AB (ref <5)

## 2017-10-26 MED ORDER — HYDROCHLOROTHIAZIDE 12.5 MG PO CAPS
25.0000 mg | ORAL_CAPSULE | Freq: Once | ORAL | Status: AC
  Administered 2017-10-26: 25 mg via ORAL
  Filled 2017-10-26: qty 2

## 2017-10-26 MED ORDER — IBUPROFEN 200 MG PO TABS
600.0000 mg | ORAL_TABLET | Freq: Once | ORAL | Status: AC
  Administered 2017-10-26: 600 mg via ORAL
  Filled 2017-10-26: qty 3

## 2017-10-26 MED ORDER — TRAMADOL HCL 50 MG PO TABS
50.0000 mg | ORAL_TABLET | Freq: Once | ORAL | Status: AC
  Administered 2017-10-26: 50 mg via ORAL
  Filled 2017-10-26: qty 1

## 2017-10-26 MED ORDER — TRAMADOL HCL 50 MG PO TABS: 50.0000 mg | ORAL_TABLET | Freq: Four times a day (QID) | ORAL | 0 refills | Status: DC | PRN

## 2017-10-26 MED ORDER — POTASSIUM CHLORIDE CRYS ER 20 MEQ PO TBCR
20.0000 meq | EXTENDED_RELEASE_TABLET | Freq: Once | ORAL | Status: AC
  Administered 2017-10-26: 20 meq via ORAL
  Filled 2017-10-26: qty 1

## 2017-10-26 MED ORDER — LISINOPRIL 20 MG PO TABS
20.0000 mg | ORAL_TABLET | Freq: Once | ORAL | Status: AC
Start: 2017-10-26 — End: 2017-10-26
  Administered 2017-10-26: 20 mg via ORAL
  Filled 2017-10-26: qty 1

## 2017-10-26 NOTE — ED Notes (Signed)
Pt used restroom ?

## 2017-10-26 NOTE — ED Provider Notes (Signed)
[facility] DEPT Provider Note   CSN: [id number] Arrival date & time: 10/26/17  7209     History   Chief Complaint Chief Complaint  Patient presents with  . Chest Pain    HPI Janice Deleon is a 62 y.o. female.  The history is provided by the patient. No language interpreter was used.  Chest Pain     Janice Deleon is a 62 y.o. female who presents to the Emergency Department complaining of chest pain.  Reports 1 week of constant right-sided chest pain.  Pain is described as a soreness in nature.  It is worse with moving her arm as well as twisting, breathing and activity.  No associated fever, cough, abdominal pain, nausea, vomiting, diaphoresis, leg swelling or pain.  She has a history of hypertension but did not take her medications this morning.  No history of DVT or blood clots, recent surgeries.  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 Past Medical History:  Diagnosis Date  . GERD (gastroesophageal reflux disease)   . Hypertension     Patient Active Problem List   Diagnosis Date Noted  . BV (bacterial vaginosis) 11/29/2014  . Trichimoniasis 11/29/2014  . Vitamin D deficiency 10/23/2014  . Esophageal reflux 05/18/2014  . HYPERLIPIDEMIA 07/19/2006  . DEPRESSION 07/19/2006  . Essential hypertension 07/19/2006  . RADIATION PROCTITIS 07/19/2006  . ABDOMINAL PAIN 07/19/2006  . CERVICAL CANCER, HX OF 07/19/2006  . COCAINE ABUSE, HX OF 07/19/2006  . NEPHRECTOMY, HX OF 07/19/2006    Past Surgical History:  Procedure Laterality Date  . ABDOMINAL HYSTERECTOMY    . colonscopy      at the age of 38  . right nephrectomy       OB History    No data available       Home Medications    Prior to Admission medications   Medication Sig Start Date End Date Taking? Authorizing Provider  acetaminophen (TYLENOL) 500 MG tablet Take 1,500 mg every 4  (four) hours as needed by mouth for mild pain, moderate pain, fever or headache.   Yes [provider]  dicyclomine (BENTYL) 20 MG tablet Take 1 tablet (20 mg total) 2 (two) times daily as needed by mouth (abdominal pain). 07/12/17  Yes Gareth Morgan, MD  lisinopril-hydrochlorothiazide (PRINZIDE,ZESTORETIC) 20-25 MG per tablet Take 1 tablet by mouth daily. 11/27/14  Yes Funches, Josalyn, MD  omeprazole (PRILOSEC) 20 MG capsule Take 1 capsule (20 mg total) by mouth daily. Patient taking differently: Take 20 mg by mouth daily as needed.  10/23/14  Yes Advani, Vernon Prey, MD  ondansetron (ZOFRAN ODT) 4 MG disintegrating tablet Take 1 tablet (4 mg total) every 8 (eight) hours as needed by mouth for nausea or vomiting. 07/12/17  Yes Gareth Morgan, MD  Vitamin D, Ergocalciferol, (DRISDOL) 50000 UNITS CAPS capsule Take 1 capsule (50,000 Units total) by mouth every 7 (seven) days. Patient taking differently: Take 50,000 Units every Wednesday by mouth.  05/21/14  Yes Advani, Deepak, MD  traMADol (ULTRAM) 50 MG tablet Take 1 tablet (50 mg total) by mouth every 6 (six) hours as needed for moderate pain. 10/26/17   Quintella Reichert, MD    Family History Family History  Problem Relation Age of Onset  . Hypertension Mother   . Diabetes Mother   . Heart disease Mother   . Hypertension Father   . Cancer Father  colon cancer   . Hypertension Sister   . Diabetes Sister   . Hypertension Brother   . Diabetes Brother   . Stroke Maternal Aunt     Social History Social History   Tobacco Use  . Smoking status: Never Smoker  . Smokeless tobacco: Never Used  Substance Use Topics  . Alcohol use: No  . Drug use: No     Allergies   Hydrocodone-acetaminophen   Review of Systems Review of Systems  Cardiovascular: Positive for chest pain.  All other systems reviewed and are negative.    Physical Exam Updated Vital Signs BP (!) 189/95   Pulse 70   Temp 98.7 F (37.1 C) (Oral)    Resp 16   LMP 08/31/1998   SpO2 99%   Physical Exam  Constitutional: She is oriented to person, place, and time. She appears well-developed and well-nourished.  HENT:  Head: Normocephalic and atraumatic.  Cardiovascular: Normal rate and regular rhythm.  No murmur heard. Pulmonary/Chest: Effort normal and breath sounds normal. No respiratory distress.  Tenderness to palpation over the right anterior and posterior chest wall.  No overlying rashes or lesions.  Abdominal: Soft. There is no tenderness. There is no rebound and no guarding.  Musculoskeletal: She exhibits no edema or tenderness.  2+ radial pulses bilaterally.  There is pain referred to the chest with abduction of the right arm.  Neurological: She is alert and oriented to person, place, and time.  5 out of 5 grip strength in bilateral upper extremities.  Skin: Skin is warm and dry.  Psychiatric: She has a normal mood and affect. Her behavior is normal.  Nursing note and vitals reviewed.    ED Treatments / Results  Labs (all labs ordered are listed, but only abnormal results are displayed) Labs Reviewed  BASIC METABOLIC PANEL - Abnormal; Notable for the following components:      Result Value   Potassium 3.1 (*)    All other components within normal limits  I-STAT BETA HCG BLOOD, ED ([facility], [facility], [facility] ONLY) - Abnormal; Notable for the following components:   I-stat hCG, quantitative 5.4 (*)    All other components within normal limits  CBC  I-STAT TROPONIN, ED    EKG  EKG Interpretation  Date/Time:  Tuesday October 26 2017 09:56:49 EST Ventricular Rate:  68 PR Interval:    QRS Duration: 85 QT Interval:  387 QTC Calculation: 412 R Axis:   68 Text Interpretation:  Sinus rhythm Probable left atrial enlargement Baseline wander in lead(s) III Confirmed by Quintella Reichert [phone]) on 10/26/2017 10:36:45 AM Also confirmed by Quintella Reichert [phone]), editor Philomena Doheny [phone])  on 10/26/2017 11:42:50 AM        Radiology Dg Chest 2 View  Result Date: 10/26/2017 CLINICAL DATA:  Right chest pain, over the past week. EXAM: CHEST  2 VIEW COMPARISON:  12/31/2004 FINDINGS: Linear bandlike densities in the left mid lung, left lung base, and right lung base, morphology favoring scarring or mild atelectasis. There is some very minimal pleural thickening laterally along the right costophrenic angle, but I do not see an appreciable fracture of the adjacent lateral ninth rib or other ribs. Tortuous thoracic aorta. Mild enlargement of the cardiopericardial silhouette, without edema or findings of pulmonary venous hypertension. No pleural effusion. Minimal thoracic spondylosis. IMPRESSION: 1. Lingular and bibasilar bandlike densities favoring scarring or mild atelectasis, as shown on 07/12/2017. 2. Mild enlargement of the cardiopericardial silhouette, without edema. 3. Tortuous thoracic aorta. 4. Minimal pleural thickening  along the right lateral costophrenic angle. No definite adjacent fracture is appreciated on today's conventional radiographs. Electronically Signed   By: Van Clines M.D.   On: 10/26/2017 10:41    Procedures Procedures (including critical care time)  Medications Ordered in ED Medications  lisinopril (PRINIVIL,ZESTRIL) tablet 20 mg (not administered)  potassium chloride SA (K-DUR,KLOR-CON) CR tablet 20 mEq (not administered)  ibuprofen (ADVIL,MOTRIN) tablet 600 mg (not administered)  traMADol (ULTRAM) tablet 50 mg (50 mg Oral Given 10/26/17 1207)  hydrochlorothiazide (MICROZIDE) capsule 25 mg (25 mg Oral Given 10/26/17 1208)     Initial Impression / Assessment and Plan / ED Course  I have reviewed the triage vital signs and the nursing notes.  Pertinent labs & imaging results that were available during my care of the patient were reviewed by me and considered in my medical decision making (see chart for details).    Patient here for evaluation of 1 week of constant right-sided chest  pain.  Pain is reproducible on examination.  Presentation is not consistent with hypertensive urgency, ACS, PE, dissection, referred biliary pain.  Discussed with patient home care for chest wall pain with anti-inflammatories, activities as tolerated, warm compresses.  Discussed with patient's hypokalemia as well as hypertension in the department.  She did not take her blood pressure medications today.  Discussed with patient importance of compliance with her home medications as well as importance of close PCP follow-up.  Return precautions discussed.  Final Clinical Impressions(s) / ED Diagnoses   Final diagnoses:  Chest wall pain  Hypokalemia  Essential hypertension    ED Discharge Orders        Ordered    traMADol (ULTRAM) 50 MG tablet  Every 6 hours PRN     10/26/17 1311       Quintella Reichert, MD 10/26/17 1313

## 2017-10-26 NOTE — Discharge Instructions (Signed)
You can take ibuprofen, available over-the-counter for pain.  Your blood pressure was high today.  Please take your blood pressure medications as prescribed and check your blood pressure once or twice a day and write down the number.  Please follow-up with your family doctor later this week for recheck.

## 2017-10-26 NOTE — ED Triage Notes (Signed)
Patient c/o right sided chest pains that have been constant since last week. Reports that pain is worse with movement. Pt has HTN and hasnt taken her medications today.

## 2018-06-16 DIAGNOSIS — Z0000 Encounter for general adult medical examination without abnormal findings: Secondary | ICD-10-CM | POA: Diagnosis not present

## 2018-06-16 DIAGNOSIS — Z1211 Encounter for screening for malignant neoplasm of colon: Secondary | ICD-10-CM | POA: Diagnosis not present

## 2018-06-16 DIAGNOSIS — R109 Unspecified abdominal pain: Secondary | ICD-10-CM | POA: Diagnosis not present

## 2018-06-16 DIAGNOSIS — I10 Essential (primary) hypertension: Secondary | ICD-10-CM | POA: Diagnosis not present

## 2018-07-11 ENCOUNTER — Emergency Department (HOSPITAL_COMMUNITY): Payer: Medicare Other

## 2018-07-11 ENCOUNTER — Encounter (HOSPITAL_COMMUNITY): Payer: Self-pay

## 2018-07-11 ENCOUNTER — Emergency Department (HOSPITAL_COMMUNITY)
Admission: EM | Admit: 2018-07-11 | Discharge: 2018-07-11 | Disposition: A | Discharge status: Home / Self Care | Payer: Medicare Other | Attending: Emergency Medicine | Admitting: Emergency Medicine

## 2018-07-11 ENCOUNTER — Other Ambulatory Visit: Payer: Self-pay

## 2018-07-11 DIAGNOSIS — R1084 Generalized abdominal pain: Secondary | ICD-10-CM | POA: Diagnosis not present

## 2018-07-11 DIAGNOSIS — R1013 Epigastric pain: Secondary | ICD-10-CM | POA: Diagnosis not present

## 2018-07-11 DIAGNOSIS — I10 Essential (primary) hypertension: Secondary | ICD-10-CM | POA: Diagnosis not present

## 2018-07-11 DIAGNOSIS — N3001 Acute cystitis with hematuria: Secondary | ICD-10-CM | POA: Diagnosis not present

## 2018-07-11 DIAGNOSIS — Z79899 Other long term (current) drug therapy: Secondary | ICD-10-CM | POA: Diagnosis not present

## 2018-07-11 DIAGNOSIS — R197 Diarrhea, unspecified: Secondary | ICD-10-CM | POA: Diagnosis not present

## 2018-07-11 DIAGNOSIS — R634 Abnormal weight loss: Secondary | ICD-10-CM | POA: Insufficient documentation

## 2018-07-11 DIAGNOSIS — R1110 Vomiting, unspecified: Secondary | ICD-10-CM | POA: Diagnosis not present

## 2018-07-11 LAB — COMPREHENSIVE METABOLIC PANEL
ALT: 10 U/L (ref 0–44)
AST: 12 U/L — AB (ref 15–41)
Albumin: 3.5 g/dL (ref 3.5–5.0)
Alkaline Phosphatase: 63 U/L (ref 38–126)
Anion gap: 4 — ABNORMAL LOW (ref 5–15)
BUN: 15 mg/dL (ref 6–20)
CHLORIDE: 106 mmol/L (ref 98–111)
CO2: 29 mmol/L (ref 22–32)
CREATININE: 0.9 mg/dL (ref 0.44–1.00)
Calcium: 9 mg/dL (ref 8.9–10.3)
GFR calc Af Amer: >60 mL/min (ref >60)
GFR calc non Af Amer: >60 mL/min (ref >60)
Glucose, Bld: 95 mg/dL (ref 70–99)
Potassium: 3.6 mmol/L (ref 3.5–5.1)
SODIUM: 139 mmol/L (ref 135–145)
Total Bilirubin: 0.4 mg/dL (ref 0.3–1.2)
Total Protein: 6.7 g/dL (ref 6.5–8.1)

## 2018-07-11 LAB — URINALYSIS, ROUTINE W REFLEX MICROSCOPIC
BILIRUBIN URINE: NEGATIVE
Glucose, UA: NEGATIVE mg/dL
Ketones, ur: 5 mg/dL — AB
Nitrite: NEGATIVE
Protein, ur: 30 mg/dL — AB
Specific Gravity, Urine: 1.032 — ABNORMAL HIGH (ref 1.005–1.030)
pH: 5 (ref 5.0–8.0)

## 2018-07-11 LAB — CBC
HEMATOCRIT: 37 % (ref 36.0–46.0)
Hemoglobin: 11.5 g/dL — ABNORMAL LOW (ref 12.0–15.0)
MCH: 29.3 pg (ref 26.0–34.0)
MCHC: 31.1 g/dL (ref 30.0–36.0)
MCV: 94.4 fL (ref 80.0–100.0)
Platelets: 360 10*3/uL (ref 150–400)
RBC: 3.92 MIL/uL (ref 3.87–5.11)
RDW: 13.2 % (ref 11.5–15.5)
WBC: 4 10*3/uL (ref 4.0–10.5)
nRBC: 0 % (ref 0.0–0.2)

## 2018-07-11 LAB — I-STAT BETA HCG BLOOD, ED (MC, WL, AP ONLY)

## 2018-07-11 LAB — LIPASE, BLOOD: LIPASE: 32 U/L (ref 11–51)

## 2018-07-11 MED ORDER — IOHEXOL 300 MG/ML  SOLN
100.0000 mL | Freq: Once | INTRAMUSCULAR | Status: AC | PRN
  Administered 2018-07-11: 100 mL via INTRAVENOUS

## 2018-07-11 MED ORDER — ONDANSETRON 4 MG PO TBDP: 4.0000 mg | ORAL_TABLET | Freq: Three times a day (TID) | ORAL | 1 refills | Status: DC | PRN

## 2018-07-11 MED ORDER — HYDROMORPHONE HCL 4 MG PO TABS: 4.0000 mg | ORAL_TABLET | Freq: Four times a day (QID) | ORAL | 0 refills | Status: DC | PRN

## 2018-07-11 MED ORDER — ONDANSETRON HCL 4 MG/2ML IJ SOLN
4.0000 mg | Freq: Once | INTRAMUSCULAR | Status: AC
  Administered 2018-07-11: 4 mg via INTRAVENOUS
  Filled 2018-07-11: qty 2

## 2018-07-11 MED ORDER — SODIUM CHLORIDE 0.9 % IV SOLN: INTRAVENOUS | Status: DC

## 2018-07-11 MED ORDER — HYDROMORPHONE HCL 1 MG/ML IJ SOLN
0.5000 mg | Freq: Once | INTRAMUSCULAR | Status: AC
  Administered 2018-07-11: 0.5 mg via INTRAVENOUS
  Filled 2018-07-11: qty 1

## 2018-07-11 MED ORDER — CEPHALEXIN 500 MG PO CAPS: 500.0000 mg | ORAL_CAPSULE | Freq: Four times a day (QID) | ORAL | 0 refills | Status: DC

## 2018-07-11 MED ORDER — SODIUM CHLORIDE 0.9 % IV SOLN
INTRAVENOUS | Status: DC
  Administered 2018-07-11: 19:00:00 via INTRAVENOUS

## 2018-07-11 MED ORDER — CEPHALEXIN 250 MG PO CAPS
500.0000 mg | ORAL_CAPSULE | Freq: Once | ORAL | Status: AC
  Administered 2018-07-11: 500 mg via ORAL
  Filled 2018-07-11: qty 2

## 2018-07-11 NOTE — ED Triage Notes (Signed)
Pt BIB POV for eval of generalized abd pain. Reports worse w/ eating, states +diarrhea. States she has been vomiting for the last 3 days. Reports she was seen last month at GI and they dx'd her w/ a "stomach virus", but didn't elaborate further. Pt reports she is down 2 pant sizes in 1 month d/t unintended weight loss.

## 2018-07-11 NOTE — ED Provider Notes (Signed)
[facility] EMERGENCY DEPARTMENT Provider Note   CSN: [id number] Arrival date & time: 07/11/18  1734     History   Chief Complaint Chief Complaint  Patient presents with  . Abdominal Pain    HPI CEARRA PORTNOY is a 62 y.o. female.  Patient followed by [facility] for primary care provider.  Patient presents tonight with generalized abdominal pain.  States is been going on on and off since October 1.  But for the past 5 days has been more constant.  Associated with a couple episodes of vomiting on Saturday but none since.  2 loose bowel movements today.  No fevers.  No blood in the vomit no blood in the bowel movements.  Also patient had a significant weight loss over the past 2 months.  Also has had some dizziness no vertigo no fevers no dysuria.  Seen by her primary care doctor a few weeks ago they thought that symptoms could be just a gastroenteritis.  But they have persisted.     Past Medical History:  Diagnosis Date  . GERD (gastroesophageal reflux disease)   . Hypertension     Patient Active Problem List   Diagnosis Date Noted  . BV (bacterial vaginosis) 11/29/2014  . Trichimoniasis 11/29/2014  . Vitamin D deficiency 10/23/2014  . Esophageal reflux 05/18/2014  . HYPERLIPIDEMIA 07/19/2006  . DEPRESSION 07/19/2006  . Essential hypertension 07/19/2006  . RADIATION PROCTITIS 07/19/2006  . ABDOMINAL PAIN 07/19/2006  . CERVICAL CANCER, HX OF 07/19/2006  . COCAINE ABUSE, HX OF 07/19/2006  . NEPHRECTOMY, HX OF 07/19/2006    Past Surgical History:  Procedure Laterality Date  . ABDOMINAL HYSTERECTOMY    . colonscopy      at the age of 73  . right nephrectomy        OB History   None      Home Medications    Prior to Admission medications   Medication Sig Start Date End Date Taking? Authorizing Provider  amLODIPine-Valsartan-HCTZ 10-160-12.5 MG TABS Take 1 tablet by mouth daily. 06/17/18  Yes [provider]  loperamide (IMODIUM A-D) 2 MG tablet Take 2 mg by mouth 4 (four) times daily as needed for diarrhea or loose stools.   Yes [provider]  omeprazole (PRILOSEC) 20 MG capsule Take 1 capsule (20 mg total) by mouth daily. Patient taking differently: Take 20 mg by mouth daily as needed.  10/23/14  Yes Advani, Vernon Prey, MD  dicyclomine (BENTYL) 20 MG tablet Take 1 tablet (20 mg total) 2 (two) times daily as needed by mouth (abdominal pain). Patient not taking: Reported on 07/11/2018 07/12/17   Gareth Morgan, MD  lisinopril-hydrochlorothiazide (PRINZIDE,ZESTORETIC) 20-25 MG per tablet Take 1 tablet by mouth daily. Patient not taking: Reported on 07/11/2018 11/27/14   Boykin Nearing, MD  ondansetron (ZOFRAN ODT) 4 MG disintegrating tablet Take 1 tablet (4 mg total) every 8 (eight) hours as needed by mouth for nausea or vomiting. Patient not taking: Reported on 07/11/2018 07/12/17   Gareth Morgan, MD  traMADol (ULTRAM) 50 MG tablet Take 1 tablet (50 mg total) by mouth every 6 (six) hours as needed for moderate pain. Patient not taking: Reported on 07/11/2018 10/26/17   Quintella Reichert, MD  Vitamin D, Ergocalciferol, (DRISDOL) 50000 UNITS CAPS capsule Take 1 capsule (50,000 Units total) by mouth every 7 (seven) days. Patient not taking: Reported on 07/11/2018 05/21/14   Lorayne Marek, MD    Family History Family History  Problem Relation  Age of Onset  . Hypertension Mother   . Diabetes Mother   . Heart disease Mother   . Hypertension Father   . Cancer Father        colon cancer   . Hypertension Sister   . Diabetes Sister   . Hypertension Brother   . Diabetes Brother   . Stroke Maternal Aunt     Social History Social History   Tobacco Use  . Smoking status: Never Smoker  . Smokeless tobacco: Never Used  Substance Use Topics  . Alcohol use: No  . Drug use: No     Allergies   Hydrocodone-acetaminophen   Review of Systems Review of Systems  Constitutional:  Positive for unexpected weight change.  HENT: Negative for congestion.   Eyes: Negative for redness.  Respiratory: Negative for shortness of breath.   Cardiovascular: Negative for chest pain.  Gastrointestinal: Positive for abdominal pain, diarrhea, nausea and vomiting.  Genitourinary: Negative for dysuria and hematuria.  Musculoskeletal: Negative for back pain and myalgias.  Skin: Negative for rash.  Neurological: Positive for dizziness. Negative for syncope, speech difficulty, weakness, numbness and headaches.  Hematological: Does not bruise/bleed easily.  Psychiatric/Behavioral: Negative for confusion.     Physical Exam Updated Vital Signs BP (!) 152/93   Pulse 65   Resp 12   Ht 1.6 m (5\' 3" )   Wt 56.7 kg   LMP 08/31/1998   SpO2 100%   BMI 22.14 kg/m   Physical Exam  Constitutional: She is oriented to person, place, and time. She appears well-developed and well-nourished. No distress.  HENT:  Head: Normocephalic and atraumatic.  Mouth/Throat: Oropharynx is clear and moist.  Eyes: Pupils are equal, round, and reactive to light. Conjunctivae and EOM are normal.  Neck: Normal range of motion. Neck supple. No thyromegaly present.  Cardiovascular: Normal rate, regular rhythm and normal heart sounds.  Pulmonary/Chest: Effort normal and breath sounds normal. No respiratory distress.  Abdominal: Soft. Bowel sounds are normal. She exhibits no distension. There is no tenderness.  Musculoskeletal: Normal range of motion. She exhibits no edema.  Lymphadenopathy:    She has no cervical adenopathy.  Neurological: She is alert and oriented to person, place, and time. No cranial nerve deficit or sensory deficit. She exhibits normal muscle tone. Coordination normal.  Skin: Skin is warm. No rash noted.  Nursing note and vitals reviewed.    ED Treatments / Results  Labs (all labs ordered are listed, but only abnormal results are displayed) Labs Reviewed  COMPREHENSIVE METABOLIC  PANEL - Abnormal; Notable for the following components:      Result Value   AST 12 (*)    Anion gap 4 (*)    All other components within normal limits  CBC - Abnormal; Notable for the following components:   Hemoglobin 11.5 (*)    All other components within normal limits  URINALYSIS, ROUTINE W REFLEX MICROSCOPIC - Abnormal; Notable for the following components:   APPearance CLOUDY (*)    Specific Gravity, Urine 1.032 (*)    Hgb urine dipstick SMALL (*)    Ketones, ur 5 (*)    Protein, ur 30 (*)    Leukocytes, UA LARGE (*)    Bacteria, UA FEW (*)    Squamous Epithelial / LPF >50 (*)    All other components within normal limits  URINE CULTURE  LIPASE, BLOOD  I-STAT BETA HCG BLOOD, ED ([facility], [facility], [facility] ONLY)    EKG None  Radiology Dg Chest 2 View  Result Date: 07/11/2018 CLINICAL DATA:  Epigastric abdominal pain EXAM: CHEST - 2 VIEW COMPARISON:  10/26/2017 FINDINGS: Mild cardiomegaly. No focal airspace consolidation or pulmonary edema. No pleural effusion or pneumothorax. IMPRESSION: No active cardiopulmonary disease. Electronically Signed   By: Ulyses Jarred M.D.   On: 07/11/2018 20:06   Ct Abdomen Pelvis W Contrast  Result Date: 07/11/2018 CLINICAL DATA:  62 year old female with generalized abdominal pain. Nausea vomiting diarrhea. History of treated uterine cancer. Prior small bowel obstruction. EXAM: CT ABDOMEN AND PELVIS WITH CONTRAST TECHNIQUE: Multidetector CT imaging of the abdomen and pelvis was performed using the standard protocol following bolus administration of intravenous contrast. CONTRAST:  165mL OMNIPAQUE IOHEXOL 300 MG/ML  SOLN COMPARISON:  CT Abdomen and Pelvis 07/12/2017, 12/10/2014. FINDINGS: Lower chest: No pericardial or pleural effusion. Borderline to mild cardiomegaly. Linear atelectasis or scarring at the lung bases is unchanged since 2018. Hepatobiliary: Negative liver and gallbladder. Pancreas: Negative. Spleen: Negative. Adrenals/Urinary Tract: Normal adrenal  glands. Solitary left kidney appears stable and within normal limits. Left renal enhancement and contrast excretion within normal limits. Decompressed proximal left ureter. Stable urinary bladder since 2018 with mild chronic pelvic soft tissue thickening. Stomach/Bowel: Chronic presacral and perirectal soft tissue thickening appears stable since 2018. Stable rectum with mild wall thickening. Negative sigmoid colon. Gas-filled but nondilated descending colon. Mix of gas and stool in the transverse and right colon. Normal appendix (coronal image 55). Negative terminal ileum. No dilated small bowel. Stable appearance of distal small bowel since 2018 with some chronic wall thickening in the deep pelvis. Proximal loops appear stable and within normal limits. Negative stomach and duodenum. No free air, free fluid. Vascular/Lymphatic: Aortoiliac calcified atherosclerosis. Major arterial structures are patent. Portal venous system is patent. No lymphadenopathy. Reproductive: Surgically absent.  Stable vaginal cuff. Other: Chronic presacral soft tissue thickening is stable. Musculoskeletal: No acute osseous abnormality identified. IMPRESSION: 1. Stable CT Abdomen and Pelvis. Post treatment CT appearance of the pelvis is unchanged since 2018. 2. No acute, inflammatory, or metastatic process identified. Electronically Signed   By: Genevie Ann M.D.   On: 07/11/2018 21:05    Procedures Procedures (including critical care time)  Medications Ordered in ED Medications  0.9 %  sodium chloride infusion ( Intravenous New Bag/Given 07/11/18 1831)  ondansetron (ZOFRAN) injection 4 mg (4 mg Intravenous Given 07/11/18 1830)  HYDROmorphone (DILAUDID) injection 0.5 mg (0.5 mg Intravenous Given 07/11/18 1830)  iohexol (OMNIPAQUE) 300 MG/ML solution 100 mL (100 mLs Intravenous Contrast Given 07/11/18 2034)     Initial Impression / Assessment and Plan / ED Course  I have reviewed the triage vital signs and the nursing  notes.  Pertinent labs & imaging results that were available during my care of the patient were reviewed by me and considered in my medical decision making (see chart for details).     Patient's history of the weight loss and the abdominal pain is concerning.  Chest x-ray without acute findings.  CT abdomen pelvis with contrast without any acute abnormalities.  Labs without any significant abnormalities with the exception of the urine which seems to be consistent with a urinary tract infection.  Patient's urine sent for culture.  Treated here with oral Keflex and will be sent home with oral Keflex.  Symptomatic treatment for her rare episodes of vomiting nausea and some rare episodes of diarrhea she will be treated with hydromorphone oral tablets she tolerated IV hydromorphone here fine.  Patient also be treated with Zofran for that.  Due to  the GI symptoms and the weight loss patient referred to gastroenterology for consideration for upper endoscopy and colonoscopy.  Also recommend follow back up with primary care doctor to have her urinalysis rechecked because of concerns for urinary tract infection culture was sent.  Patient will be continued on Keflex.  Final Clinical Impressions(s) / ED Diagnoses   Final diagnoses:  Generalized abdominal pain  Weight loss, non-intentional  Acute cystitis with hematuria    ED Discharge Orders    None       Fredia Sorrow, MD 07/11/18 2229

## 2018-07-11 NOTE — Discharge Instructions (Addendum)
Work-up here today with chest x-ray and CAT scan of the abdomen without any acute findings.  But your unexpected weight loss is of concern.  Call [facility] gastroenterology for evaluation recommend colonoscopy and perhaps upper endoscopy.  Also make an appointment back with your primary care provider.  And let them know about the work-up we did today.  Return for any new or worse symptoms.  Take pain medication as directed.  Take the antinausea medicine as directed.  In addition urinalysis as we discussed suggestive urinary tract infection.  Urine sent for culture.  Take the antibiotic as directed.  Would expect some improvement over the next few days.  Again make an appointment follow-up with your primary care doctor to have your urine rechecked.

## 2018-07-11 NOTE — ED Notes (Signed)
ED Provider at bedside. 

## 2018-07-11 NOTE — ED Notes (Signed)
Patient verbalizes understanding of discharge instructions. Opportunity for questioning and answers were provided. Armband removed by staff, pt discharged from ED ambulatory.   

## 2018-07-21 DIAGNOSIS — I10 Essential (primary) hypertension: Secondary | ICD-10-CM | POA: Diagnosis not present

## 2018-07-21 DIAGNOSIS — Z713 Dietary counseling and surveillance: Secondary | ICD-10-CM | POA: Diagnosis not present

## 2018-07-21 DIAGNOSIS — Z09 Encounter for follow-up examination after completed treatment for conditions other than malignant neoplasm: Secondary | ICD-10-CM | POA: Diagnosis not present

## 2019-03-23 DIAGNOSIS — Z13220 Encounter for screening for lipoid disorders: Secondary | ICD-10-CM | POA: Diagnosis not present

## 2019-03-23 DIAGNOSIS — E559 Vitamin D deficiency, unspecified: Secondary | ICD-10-CM | POA: Diagnosis not present

## 2019-03-23 DIAGNOSIS — I10 Essential (primary) hypertension: Secondary | ICD-10-CM | POA: Diagnosis not present

## 2019-07-24 ENCOUNTER — Other Ambulatory Visit: Payer: Self-pay | Admitting: Osteopathic Medicine

## 2019-07-24 DIAGNOSIS — Z1231 Encounter for screening mammogram for malignant neoplasm of breast: Secondary | ICD-10-CM

## 2019-08-10 ENCOUNTER — Other Ambulatory Visit: Payer: Self-pay

## 2019-08-10 DIAGNOSIS — Z20822 Contact with and (suspected) exposure to covid-19: Secondary | ICD-10-CM

## 2019-08-12 LAB — NOVEL CORONAVIRUS, NAA: SARS-CoV-2, NAA: NOT DETECTED

## 2019-08-19 DIAGNOSIS — U071 COVID-19: Secondary | ICD-10-CM | POA: Diagnosis not present

## 2019-09-04 ENCOUNTER — Other Ambulatory Visit: Payer: Medicare Other

## 2019-09-06 ENCOUNTER — Ambulatory Visit: Payer: Medicare Other | Attending: Internal Medicine

## 2019-09-06 DIAGNOSIS — Z20822 Contact with and (suspected) exposure to covid-19: Secondary | ICD-10-CM

## 2019-09-08 LAB — NOVEL CORONAVIRUS, NAA: SARS-CoV-2, NAA: NOT DETECTED

## 2019-09-19 ENCOUNTER — Ambulatory Visit: Payer: Medicare Other

## 2019-10-23 ENCOUNTER — Other Ambulatory Visit: Payer: Self-pay

## 2019-10-23 ENCOUNTER — Ambulatory Visit
Admission: RE | Admit: 2019-10-23 | Discharge: 2019-10-23 | Disposition: A | Discharge status: Home / Self Care | Payer: Medicare Other | Source: Ambulatory Visit | Attending: Osteopathic Medicine | Admitting: Osteopathic Medicine

## 2019-10-23 DIAGNOSIS — Z1231 Encounter for screening mammogram for malignant neoplasm of breast: Secondary | ICD-10-CM

## 2020-04-22 ENCOUNTER — Other Ambulatory Visit: Payer: Self-pay | Admitting: Student

## 2020-04-22 DIAGNOSIS — Z1159 Encounter for screening for other viral diseases: Secondary | ICD-10-CM | POA: Diagnosis not present

## 2020-04-22 DIAGNOSIS — M25511 Pain in right shoulder: Secondary | ICD-10-CM | POA: Diagnosis not present

## 2020-04-22 DIAGNOSIS — E559 Vitamin D deficiency, unspecified: Secondary | ICD-10-CM | POA: Diagnosis not present

## 2020-04-22 DIAGNOSIS — Z79899 Other long term (current) drug therapy: Secondary | ICD-10-CM | POA: Diagnosis not present

## 2020-04-22 DIAGNOSIS — I10 Essential (primary) hypertension: Secondary | ICD-10-CM | POA: Diagnosis not present

## 2020-04-22 DIAGNOSIS — M25512 Pain in left shoulder: Secondary | ICD-10-CM | POA: Diagnosis not present

## 2020-04-23 ENCOUNTER — Other Ambulatory Visit: Payer: Self-pay | Admitting: Student

## 2020-04-23 DIAGNOSIS — M542 Cervicalgia: Secondary | ICD-10-CM

## 2020-05-10 ENCOUNTER — Other Ambulatory Visit: Payer: Self-pay | Admitting: Student

## 2020-05-13 ENCOUNTER — Other Ambulatory Visit: Payer: Self-pay | Admitting: Student

## 2020-05-13 DIAGNOSIS — M25511 Pain in right shoulder: Secondary | ICD-10-CM

## 2020-05-22 ENCOUNTER — Ambulatory Visit
Admission: RE | Admit: 2020-05-22 | Discharge: 2020-05-22 | Disposition: A | Discharge status: Home / Self Care | Payer: Medicare Other | Source: Ambulatory Visit | Attending: Student | Admitting: Student

## 2020-05-22 DIAGNOSIS — E042 Nontoxic multinodular goiter: Secondary | ICD-10-CM | POA: Diagnosis not present

## 2020-05-22 DIAGNOSIS — M542 Cervicalgia: Secondary | ICD-10-CM

## 2020-05-22 DIAGNOSIS — M25511 Pain in right shoulder: Secondary | ICD-10-CM | POA: Diagnosis not present

## 2020-05-22 DIAGNOSIS — R911 Solitary pulmonary nodule: Secondary | ICD-10-CM | POA: Diagnosis not present

## 2020-06-11 DIAGNOSIS — E559 Vitamin D deficiency, unspecified: Secondary | ICD-10-CM | POA: Diagnosis not present

## 2020-06-11 DIAGNOSIS — I10 Essential (primary) hypertension: Secondary | ICD-10-CM | POA: Diagnosis not present

## 2020-06-11 DIAGNOSIS — Z0000 Encounter for general adult medical examination without abnormal findings: Secondary | ICD-10-CM | POA: Diagnosis not present

## 2021-03-04 ENCOUNTER — Encounter: Payer: Self-pay | Admitting: Neurology

## 2021-03-05 ENCOUNTER — Other Ambulatory Visit: Payer: Self-pay

## 2021-03-05 DIAGNOSIS — R202 Paresthesia of skin: Secondary | ICD-10-CM

## 2021-04-06 ENCOUNTER — Encounter (HOSPITAL_COMMUNITY): Payer: Self-pay | Admitting: Emergency Medicine

## 2021-04-06 ENCOUNTER — Emergency Department (HOSPITAL_COMMUNITY): Payer: Medicare Other

## 2021-04-06 ENCOUNTER — Emergency Department (HOSPITAL_COMMUNITY)
Admission: EM | Admit: 2021-04-06 | Discharge: 2021-04-06 | Disposition: A | Discharge status: Home / Self Care | Payer: Medicare Other | Attending: Emergency Medicine | Admitting: Emergency Medicine

## 2021-04-06 ENCOUNTER — Other Ambulatory Visit: Payer: Self-pay

## 2021-04-06 DIAGNOSIS — I10 Essential (primary) hypertension: Secondary | ICD-10-CM | POA: Diagnosis not present

## 2021-04-06 DIAGNOSIS — Z8541 Personal history of malignant neoplasm of cervix uteri: Secondary | ICD-10-CM | POA: Insufficient documentation

## 2021-04-06 DIAGNOSIS — R1084 Generalized abdominal pain: Secondary | ICD-10-CM | POA: Diagnosis present

## 2021-04-06 DIAGNOSIS — N739 Female pelvic inflammatory disease, unspecified: Secondary | ICD-10-CM

## 2021-04-06 DIAGNOSIS — Z79899 Other long term (current) drug therapy: Secondary | ICD-10-CM | POA: Diagnosis not present

## 2021-04-06 DIAGNOSIS — K219 Gastro-esophageal reflux disease without esophagitis: Secondary | ICD-10-CM | POA: Diagnosis not present

## 2021-04-06 LAB — URINALYSIS, ROUTINE W REFLEX MICROSCOPIC
Bilirubin Urine: NEGATIVE
Glucose, UA: NEGATIVE mg/dL
Ketones, ur: 20 mg/dL — AB
Nitrite: NEGATIVE
Protein, ur: >=300 mg/dL — AB
RBC / HPF: >50 RBC/hpf — ABNORMAL HIGH (ref 0–5)
Specific Gravity, Urine: 1.033 — ABNORMAL HIGH (ref 1.005–1.030)
WBC, UA: >50 WBC/hpf — ABNORMAL HIGH (ref 0–5)
pH: 5 (ref 5.0–8.0)

## 2021-04-06 LAB — COMPREHENSIVE METABOLIC PANEL
ALT: 11 U/L (ref 0–44)
AST: 13 U/L — ABNORMAL LOW (ref 15–41)
Albumin: 3.9 g/dL (ref 3.5–5.0)
Alkaline Phosphatase: 59 U/L (ref 38–126)
Anion gap: 10 (ref 5–15)
BUN: 12 mg/dL (ref 8–23)
CO2: 26 mmol/L (ref 22–32)
Calcium: 9.5 mg/dL (ref 8.9–10.3)
Chloride: 100 mmol/L (ref 98–111)
Creatinine, Ser: 1.04 mg/dL — ABNORMAL HIGH (ref 0.44–1.00)
GFR, Estimated: >60 mL/min (ref >60)
Glucose, Bld: 114 mg/dL — ABNORMAL HIGH (ref 70–99)
Potassium: 3.2 mmol/L — ABNORMAL LOW (ref 3.5–5.1)
Sodium: 136 mmol/L (ref 135–145)
Total Bilirubin: 0.6 mg/dL (ref 0.3–1.2)
Total Protein: 7.8 g/dL (ref 6.5–8.1)

## 2021-04-06 LAB — WET PREP, GENITAL
Sperm: NONE SEEN
Yeast Wet Prep HPF POC: NONE SEEN

## 2021-04-06 LAB — CBC
HCT: 40.4 % (ref 36.0–46.0)
Hemoglobin: 13.6 g/dL (ref 12.0–15.0)
MCH: 31.5 pg (ref 26.0–34.0)
MCHC: 33.7 g/dL (ref 30.0–36.0)
MCV: 93.5 fL (ref 80.0–100.0)
Platelets: 360 10*3/uL (ref 150–400)
RBC: 4.32 MIL/uL (ref 3.87–5.11)
RDW: 13 % (ref 11.5–15.5)
WBC: 8 10*3/uL (ref 4.0–10.5)
nRBC: 0 % (ref 0.0–0.2)

## 2021-04-06 LAB — LIPASE, BLOOD: Lipase: 23 U/L (ref 11–51)

## 2021-04-06 MED ORDER — FAMOTIDINE IN NACL 20-0.9 MG/50ML-% IV SOLN
20.0000 mg | Freq: Once | INTRAVENOUS | Status: AC
  Administered 2021-04-06: 20 mg via INTRAVENOUS
  Filled 2021-04-06: qty 50

## 2021-04-06 MED ORDER — IOHEXOL 350 MG/ML SOLN
100.0000 mL | Freq: Once | INTRAVENOUS | Status: AC | PRN
  Administered 2021-04-06: 100 mL via INTRAVENOUS

## 2021-04-06 MED ORDER — POTASSIUM CHLORIDE CRYS ER 20 MEQ PO TBCR
40.0000 meq | EXTENDED_RELEASE_TABLET | Freq: Once | ORAL | Status: AC
  Administered 2021-04-06: 40 meq via ORAL
  Filled 2021-04-06: qty 2

## 2021-04-06 MED ORDER — HYDROMORPHONE HCL 1 MG/ML IJ SOLN
0.5000 mg | Freq: Once | INTRAMUSCULAR | Status: AC
Start: 2021-04-06 — End: 2021-04-06
  Administered 2021-04-06: 0.5 mg via INTRAVENOUS
  Filled 2021-04-06: qty 1

## 2021-04-06 MED ORDER — METRONIDAZOLE 500 MG PO TABS: 500.0000 mg | ORAL_TABLET | Freq: Two times a day (BID) | ORAL | 0 refills | Status: DC

## 2021-04-06 MED ORDER — SODIUM CHLORIDE 0.9 % IV BOLUS
1000.0000 mL | Freq: Once | INTRAVENOUS | Status: AC
  Administered 2021-04-06: 1000 mL via INTRAVENOUS

## 2021-04-06 MED ORDER — ONDANSETRON 4 MG PO TBDP: 4.0000 mg | ORAL_TABLET | Freq: Three times a day (TID) | ORAL | 0 refills | Status: DC | PRN

## 2021-04-06 MED ORDER — CEFTRIAXONE SODIUM 500 MG IJ SOLR
500.0000 mg | Freq: Once | INTRAMUSCULAR | Status: AC
  Administered 2021-04-06: 500 mg via INTRAMUSCULAR
  Filled 2021-04-06: qty 500

## 2021-04-06 MED ORDER — DOXYCYCLINE HYCLATE 100 MG PO CAPS: 100.0000 mg | ORAL_CAPSULE | Freq: Two times a day (BID) | ORAL | 0 refills | Status: DC

## 2021-04-06 MED ORDER — ONDANSETRON HCL 4 MG/2ML IJ SOLN
4.0000 mg | Freq: Once | INTRAMUSCULAR | Status: AC
  Administered 2021-04-06: 4 mg via INTRAVENOUS
  Filled 2021-04-06: qty 2

## 2021-04-06 NOTE — Discharge Instructions (Addendum)
You were seen in the emergency department today for abdominal pain.  Your blood work showed that your potassium was mildly low, please see attached diet guidelines to include more potassium rich foods in your diet and have this rechecked by your primary care provider.  Your kidney function was also mildly elevated have this rechecked by primary care as well.  Urine showed findings of trichomonas which is a sexually transmitted infection.  Given your abdominal discomfort/pelvic discomfort with these findings we suspect that you have pelvic inflammatory disease.  We are sending you home with doxycycline and Flagyl which are both antibiotics to treat this condition.  Please take these for the next 2 weeks.  Take them with food as they can cause stomach upset.  Do not drink alcohol while taking Flagyl as it can make you very ill and is dangerous.   Do not have intercourse of any kind until least 1 week following your last dose of antibiotics.  Please inform any sexual partners so that they may be evaluated as well.  We are sending you home with Zofran to take as needed for nausea and vomiting. Please take tylenol per over-the-counter dosing to help with discomfort.   We have prescribed you new medication(s) today. Discuss the medications prescribed today with your pharmacist as they can have adverse effects and interactions with your other medicines including over the counter and prescribed medications. Seek medical evaluation if you start to experience new or abnormal symptoms after taking one of these medicines, seek care immediately if you start to experience difficulty breathing, feeling of your throat closing, facial swelling, or rash as these could be indications of a more serious allergic reaction  Please follow-up with OB/GYN within 1 week.  Return to the emergency department for new or worsening symptoms including but not limited to new or worsening pain, fever, inability to keep fluids down, or any  other concerns.  Your blood pressure was high in the ER, please be sure to take your blood pressure medication as prescribed and have this rechecked at your follow-up appointment

## 2021-04-06 NOTE — ED Provider Notes (Signed)
[facility] EMERGENCY DEPARTMENT Provider Note   CSN: [id number] Arrival date & time: 04/06/21  1225     History Chief Complaint  Patient presents with   Abdominal Pain    Janice Deleon is a 65 y.o. female with a hx of hypertension, GERD, hyperlipidemia, right nephrectomy, and hysterectomy who presents to the ED with complaints of abdominal pain x 4 days. Patient states that pain is to the generalized abdomen, intermittent, difficult to describe, worse with trying to eat/drink with associated nausea, emesis, and constipation. Last BM was day prior to onset of pain. She thinks everything started after she ate a hotdog. Has had some sweats. Denis fever, hematemesis, melena, hematochezia, dysuria, vaginal bleeding, or vaginal discharge. She typically has Bms daily, often diarrhea, takes OTC antidiarrheal medicine, has not taken this since pain started.    HPI     Past Medical History:  Diagnosis Date   GERD (gastroesophageal reflux disease)    Hypertension     Patient Active Problem List   Diagnosis Date Noted   BV (bacterial vaginosis) 11/29/2014   Trichimoniasis 11/29/2014   Vitamin D deficiency 10/23/2014   Esophageal reflux 05/18/2014   HYPERLIPIDEMIA 07/19/2006   DEPRESSION 07/19/2006   Essential hypertension 07/19/2006   RADIATION PROCTITIS 07/19/2006   ABDOMINAL PAIN 07/19/2006   CERVICAL CANCER, HX OF 07/19/2006   COCAINE ABUSE, HX OF 07/19/2006   NEPHRECTOMY, HX OF 07/19/2006    Past Surgical History:  Procedure Laterality Date   ABDOMINAL HYSTERECTOMY     colonscopy      at the age of 79   right nephrectomy        OB History   No obstetric history on file.     Family History  Problem Relation Age of Onset   Hypertension Mother    Diabetes Mother    Heart disease Mother    Hypertension Father    Cancer Father        colon cancer    Hypertension Sister    Diabetes Sister    Hypertension Brother    Diabetes Brother     Stroke Maternal Aunt     Social History   Tobacco Use   Smoking status: Never   Smokeless tobacco: Never  Vaping Use   Vaping Use: Never used  Substance Use Topics   Alcohol use: No   Drug use: No    Home Medications Prior to Admission medications   Medication Sig Start Date End Date Taking? Authorizing Provider  amLODIPine-Valsartan-HCTZ 10-160-12.5 MG TABS Take 1 tablet by mouth daily. 06/17/18   [provider]  cephALEXin (KEFLEX) 500 MG capsule Take 1 capsule (500 mg total) by mouth 4 (four) times daily. 07/11/18   Fredia Sorrow, MD  dicyclomine (BENTYL) 20 MG tablet Take 1 tablet (20 mg total) 2 (two) times daily as needed by mouth (abdominal pain). Patient not taking: Reported on 07/11/2018 07/12/17   Gareth Morgan, MD  HYDROmorphone (DILAUDID) 4 MG tablet Take 1 tablet (4 mg total) by mouth every 6 (six) hours as needed for severe pain. 07/11/18   Fredia Sorrow, MD  lisinopril-hydrochlorothiazide (PRINZIDE,ZESTORETIC) 20-25 MG per tablet Take 1 tablet by mouth daily. Patient not taking: Reported on 07/11/2018 11/27/14   Boykin Nearing, MD  loperamide (IMODIUM A-D) 2 MG tablet Take 2 mg by mouth 4 (four) times daily as needed for diarrhea or loose stools.    [provider]  omeprazole (PRILOSEC) 20 MG capsule Take 1 capsule (20 mg total)  by mouth daily. Patient taking differently: Take 20 mg by mouth daily as needed.  10/23/14   Lorayne Marek, MD  ondansetron (ZOFRAN ODT) 4 MG disintegrating tablet Take 1 tablet (4 mg total) every 8 (eight) hours as needed by mouth for nausea or vomiting. Patient not taking: Reported on 07/11/2018 07/12/17   Gareth Morgan, MD  ondansetron (ZOFRAN ODT) 4 MG disintegrating tablet Take 1 tablet (4 mg total) by mouth every 8 (eight) hours as needed for nausea or vomiting. 07/11/18   Fredia Sorrow, MD  traMADol (ULTRAM) 50 MG tablet Take 1 tablet (50 mg total) by mouth every 6 (six) hours as needed for moderate  pain. Patient not taking: Reported on 07/11/2018 10/26/17   Quintella Reichert, MD  Vitamin D, Ergocalciferol, (DRISDOL) 50000 UNITS CAPS capsule Take 1 capsule (50,000 Units total) by mouth every 7 (seven) days. Patient not taking: Reported on 07/11/2018 05/21/14   Lorayne Marek, MD    Allergies    Hydrocodone-acetaminophen  Review of Systems   Review of Systems  Constitutional:  Negative for fever.  Respiratory:  Negative for shortness of breath.   Cardiovascular:  Negative for chest pain.  Gastrointestinal:  Positive for abdominal pain, constipation, nausea and vomiting. Negative for blood in stool.  Genitourinary:  Negative for dysuria, vaginal bleeding and vaginal discharge.  Neurological:  Negative for syncope.  All other systems reviewed and are negative.  Physical Exam Updated Vital Signs BP (!) 182/116 (BP Location: Right Arm)   Pulse 90   Temp 99.4 F (37.4 C)   Resp 18   LMP 08/31/1998   SpO2 100%   Physical Exam Vitals and nursing note reviewed. Exam conducted with a chaperone present.  Constitutional:      General: She is not in acute distress.    Appearance: She is well-developed. She is not toxic-appearing.  HENT:     Head: Normocephalic and atraumatic.  Eyes:     General:        Right eye: No discharge.        Left eye: No discharge.     Conjunctiva/sclera: Conjunctivae normal.  Cardiovascular:     Rate and Rhythm: Normal rate and regular rhythm.  Pulmonary:     Effort: Pulmonary effort is normal. No respiratory distress.     Breath sounds: Normal breath sounds. No wheezing, rhonchi or rales.  Abdominal:     General: There is no distension.     Palpations: Abdomen is soft.     Tenderness: There is generalized abdominal tenderness. There is no guarding or rebound.  Genitourinary:    Comments: No external lesions.  Yellow discharge present.  Diffuse tenderness throughout bimanual exam. Musculoskeletal:     Cervical back: Neck supple.  Skin:    General:  Skin is warm and dry.     Findings: No rash.  Neurological:     Mental Status: She is alert.     Comments: Clear speech.   Psychiatric:        Behavior: Behavior normal.    ED Results / Procedures / Treatments   Labs (all labs ordered are listed, but only abnormal results are displayed) Labs Reviewed  COMPREHENSIVE METABOLIC PANEL - Abnormal; Notable for the following components:      Result Value   Potassium 3.2 (*)    Glucose, Bld 114 (*)    Creatinine, Ser 1.04 (*)    AST 13 (*)    All other components within normal limits  URINALYSIS, ROUTINE W REFLEX MICROSCOPIC -  Abnormal; Notable for the following components:   Color, Urine AMBER (*)    APPearance CLOUDY (*)    Specific Gravity, Urine 1.033 (*)    Hgb urine dipstick SMALL (*)    Ketones, ur 20 (*)    Protein, ur >=300 (*)    Leukocytes,Ua LARGE (*)    RBC / HPF >50 (*)    WBC, UA >50 (*)    Bacteria, UA RARE (*)    Trichomonas, UA PRESENT (*)    All other components within normal limits  WET PREP, GENITAL  URINE CULTURE  LIPASE, BLOOD  CBC  HIV ANTIBODY (ROUTINE TESTING W REFLEX)  RPR  GC/CHLAMYDIA PROBE AMP ([place]) NOT AT [facility]    EKG None  Radiology CT Abdomen Pelvis W Contrast  Result Date: 04/06/2021 CLINICAL DATA:  Upper abdominal pain, nausea, vomiting EXAM: CT ABDOMEN AND PELVIS WITH CONTRAST TECHNIQUE: Multidetector CT imaging of the abdomen and pelvis was performed using the standard protocol following bolus administration of intravenous contrast. CONTRAST:  119m OMNIPAQUE IOHEXOL 350 MG/ML SOLN COMPARISON:  07/11/2018 FINDINGS: Lower chest: Small pericardial effusion, stable. Mild cardiomegaly. Scarring in the lung bases. No effusions. Hepatobiliary: No focal hepatic abnormality. Gallbladder unremarkable. Pancreas: No focal abnormality or ductal dilatation. Spleen: No focal abnormality.  Normal size. Adrenals/Urinary Tract: Prior right nephrectomy. No adrenal lesion. Left kidney unremarkable. No  hydronephrosis. Urinary bladder unremarkable. Stomach/Bowel: Stomach, large and small bowel grossly unremarkable. Vascular/Lymphatic: Aortic atherosclerosis. No evidence of aneurysm or adenopathy. Reproductive: Prior hysterectomy.  No adnexal masses. Other: No free fluid or free air. Musculoskeletal: No acute bony abnormality. IMPRESSION: No acute findings in the abdomen or pelvis. Aortic atherosclerosis. Prior right nephrectomy. Electronically Signed   By: KRolm BaptiseM.D.   On: 04/06/2021 18:43    Procedures Procedures   Medications Ordered in ED Medications - No data to display  ED Course  I have reviewed the triage vital signs and the nursing notes.  Pertinent labs & imaging results that were available during my care of the patient were reviewed by me and considered in my medical decision making (see chart for details).    MDM Rules/Calculators/A&P                           Patient presents to the ED with complaints of abdominal pain. Patient nontoxic appearing, in no apparent distress, vitals with elevated BP- no signs/sxs to suggest hypertensive emergency at this time- educated on need to take BP meds as prescribed and have this closely followed up.  On exam patient tender to the generalized abdomen, no peritoneal signs.   Additional history obtained:  Additional history obtained from chart review & nursing note review.   Lab Tests:  I reviewed and interpreted labs, which included:  CBC: Unremarkable CMP: mild hypokalemia - oral replacement ordered. Mild increase in creatinine.  Lipase: WNL UA: Findings of trichomonas as well as general infection, given generalized pain including suprapubic- sent for culture.   Imaging Studies ordered:  I ordered imaging studies which included CT A/P, I independently reviewed, formal radiology impression shows: No acute findings in the abdomen or pelvis. Aortic atherosclerosis. Prior right nephrectomy  Patient without acute surgical process on  CT abdomen/pelvis, this includes no obstruction or perforation.  Given she has trichomonas on urinalysis pelvic exam was performed- yellow discharge present, diffuse tenderness on bimanual exam.  Will treat for pelvic inflammatory disease at this time with antibiotics and continue supportive care.  Discussed need to inform all sexual partners as well as need for abstinence until least 1 week following last dose of antibiotics.  Urine was sent for culture given abdominal pain including suprapubic pain and findings concerning for infection, however overall anticipate that this is related to STI. Rocephin in the ED, doxy/flagyl to go home with. Feeling improved, tolerating PO, appears appropriate for discharge. I discussed results, treatment plan, need for follow-up, and return precautions with the patient. Provided opportunity for questions, patient confirmed understanding and is in agreement with plan.    Portions of this note were generated with [employer]. Dictation errors may occur despite best attempts at proofreading.    Final Clinical Impression(s) / ED Diagnoses Final diagnoses:  Generalized abdominal pain  Pelvic inflammatory disease    Rx / DC Orders ED Discharge Orders          Ordered    doxycycline (VIBRAMYCIN) 100 MG capsule  2 times daily        04/06/21 1951    metroNIDAZOLE (FLAGYL) 500 MG tablet  2 times daily        04/06/21 1951    ondansetron (ZOFRAN ODT) 4 MG disintegrating tablet  Every 8 hours PRN        04/06/21 [address] Glynda Jaeger, PA-C 04/06/21 2021    Fredia Sorrow, MD 04/11/21 1607

## 2021-04-06 NOTE — ED Triage Notes (Signed)
Pt reports upper abd pain, nausea, and vomiting since eating a hot dog on Thursday.  States she believes she is constipated.  States she normally has a BM every morning and often has diarrhea.  States she has been taking anti-diarrheal medication for 2 years every morning when she wakes up if she has diarrhea.

## 2021-04-06 NOTE — ED Notes (Signed)
Pt moved to [place] temporarily for pelvic exam

## 2021-04-06 NOTE — ED Notes (Signed)
E-signature pad unavailable at time of pt discharge. This RN discussed discharge materials with pt and answered all pt questions. Pt stated understanding of discharge material. ? ?

## 2021-04-07 LAB — GC/CHLAMYDIA PROBE AMP (~~LOC~~) NOT AT ARMC
Chlamydia: NEGATIVE
Comment: NEGATIVE
Comment: NORMAL
Neisseria Gonorrhea: NEGATIVE

## 2021-04-09 ENCOUNTER — Other Ambulatory Visit: Payer: Self-pay

## 2021-04-09 ENCOUNTER — Ambulatory Visit (INDEPENDENT_AMBULATORY_CARE_PROVIDER_SITE_OTHER): Payer: Medicare Other | Admitting: Neurology

## 2021-04-09 DIAGNOSIS — R202 Paresthesia of skin: Secondary | ICD-10-CM | POA: Diagnosis not present

## 2021-04-09 LAB — URINE CULTURE: Culture: 70000 — AB

## 2021-04-09 NOTE — Procedures (Signed)
[facility]  [place], [address]  [place], [place] [address] Tel: [phone] Fax:  [phone] Test Date:  04/09/2021  Patient: Janice Deleon DOB: May 25, 1956 Physician: Narda Amber, DO  Sex: Female Height: '5\' 3"'$  Ref Phys: Faythe Casa, MD  ID#: [id number]   Technician:    Patient Complaints: This is a 65 year old female referred for evaluation of right hand pain.  NCV & EMG Findings: Electrodiagnostic testing was limited to nerve conduction studies only as patient did not wish to proceed with needle electrode examination.  Findings are as follows: Right median sensory response shows prolonged latency (4.1 ms).  Right ulnar sensory responses within normal limits.   Right median motor response shows prolonged latency (4.2 ms).  Right ulnar motor responses within normal limits.    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  Nerve conduction study showed mildly prolonged median sensory and motor responses, which is can be seen in carpal tunnel syndrome.  Correlate clinically.   ___________________________ Narda Amber, DO    Nerve Conduction Studies Anti Sensory Summary Table   Stim Site NR Peak (ms) Norm Peak (ms) P-T Amp (V) Norm P-T Amp  Right Median Anti Sensory (2nd Digit)  34C  Wrist    4.1 <3.8 24.5 >10  Right Ulnar Anti Sensory (5th Digit)  34C  Wrist    2.6 <3.2 29.7 >5   Motor Summary Table   Stim Site NR Onset (ms) Norm Onset (ms) O-P Amp (mV) Norm O-P Amp Site1 Site2 Delta-0 (ms) Dist (cm) Vel (m/s) Norm Vel (m/s)  Right Median Motor (Abd Poll Brev)  34C  Wrist    4.2 <4.0 8.4 >5 Elbow Wrist 5.1 27.0 53 >50  Elbow    9.3  8.3         Right Ulnar Motor (Abd Dig Minimi)  34C  Wrist    2.0 <3.1 7.3 >7 B Elbow Wrist 3.9 22.0 56 >50  B Elbow    5.9  6.2  A Elbow B Elbow 1.9 10.0 53 >50  A Elbow    7.8  6.0             Waveforms:

## 2021-04-10 ENCOUNTER — Telehealth: Payer: Self-pay

## 2021-04-10 NOTE — Telephone Encounter (Signed)
Post ED Visit - Positive Culture Follow-up  Culture report reviewed by antimicrobial stewardship pharmacist: [place] Team '[x]'$  Andreas Blower, Pharm.D. '[]'$  Heide Guile, Pharm.D., BCPS AQ-ID '[]'$  Parks Neptune, Pharm.D., BCPS '[]'$  Alycia Rossetti, Pharm.D., BCPS '[]'$  [place], Pharm.D., BCPS, AAHIVP '[]'$  Legrand Como, Pharm.D., BCPS, AAHIVP '[]'$  Salome Arnt, PharmD, BCPS '[]'$  Johnnette Gourd, PharmD, BCPS '[]'$  Hughes Better, PharmD, BCPS '[]'$  Leeroy Cha, PharmD '[]'$  Laqueta Linden, PharmD, BCPS '[]'$  Albertina Parr, PharmD  [place] Team '[]'$  Leodis Sias, PharmD '[]'$  Lindell Spar, PharmD '[]'$  Royetta Asal, PharmD '[]'$  Graylin Shiver, Rph '[]'$  Rema Fendt) Glennon Mac, PharmD '[]'$  Arlyn Dunning, PharmD '[]'$  Netta Cedars, PharmD '[]'$  Dia Sitter, PharmD '[]'$  Leone Haven, PharmD '[]'$  Gretta Arab, PharmD '[]'$  Theodis Shove, PharmD '[]'$  Peggyann Juba, PharmD '[]'$  Reuel Boom, PharmD   Positive urine culture Treated with Doxycycline Hyclate and Metronidazole , organism sensitive to the same and no further patient follow-up is required at this time. Reviewed by Dr. Calvert Cantor, MD  Glennon Hamilton 04/10/2021, 10:12 AM

## 2021-06-20 NOTE — Patient Instructions (Addendum)
DUE TO COVID-19 ONLY ONE VISITOR IS ALLOWED TO COME WITH YOU AND STAY IN THE WAITING ROOM ONLY DURING PRE OP AND PROCEDURE.   **NO VISITORS ARE ALLOWED IN THE SHORT STAY AREA OR RECOVERY ROOM!!**  IF YOU WILL BE ADMITTED INTO THE HOSPITAL YOU ARE ALLOWED ONLY TWO SUPPORT PEOPLE DURING VISITATION HOURS ONLY (10AM -8PM)   The support person(s) may change daily. The support person(s) must pass our screening, gel in and out, and wear a mask at all times, including in the patient's room. Patients must also wear a mask when staff or their support person are in the room.  No visitors under the age of 38. Any visitor under the age of 60 must be accompanied by an adult.        Your procedure is scheduled on: 07/04/21   Report to [facility] Main Entrance   Report to Short Stay at 5:15 AM   [facility])   Call this number if you have problems the morning of surgery [phone]   Do not eat food :After Midnight.   May have liquids until : 4:30 AM   day of surgery  CLEAR LIQUID DIET  Foods Allowed                                                                     Foods Excluded  Water, Black Coffee and tea, regular and decaf                             liquids that you cannot  Plain Jell-O in any flavor  (No red)                                           see through such as: Fruit ices (not with fruit pulp)                                     milk, soups, orange juice              Iced Popsicles (No red)                                    All solid food                                   Apple juices Sports drinks like Gatorade (No red) Lightly seasoned clear broth or consume(fat free) Sugar,   Sample Menu Breakfast                                Lunch                                     Supper Cranberry juice  Beef broth                            Chicken broth Jell-O                                     Grape juice                           Apple juice Coffee  or tea                        Jell-O                                      Popsicle                                                Coffee or tea                        Coffee or tea     Oral Hygiene is also important to reduce your risk of infection.                                    Remember - BRUSH YOUR TEETH THE MORNING OF SURGERY WITH YOUR REGULAR TOOTHPASTE   Do NOT smoke after Midnight   Take these medicines the morning of surgery with A SIP OF WATER: N/A  DO NOT TAKE ANY ORAL DIABETIC MEDICATIONS DAY OF YOUR SURGERY                              You may not have any metal on your body including hair pins, jewelry, and body piercing             Do not wear make-up, lotions, powders, perfumes/cologne, or deodorant  Do not wear nail polish including gel and S&S, artificial/acrylic nails, or any other type of covering on natural nails including finger and toenails. If you have artificial nails, gel coating, etc. that needs to be removed by a nail salon please have this removed prior to surgery or surgery may need to be canceled/ delayed if the surgeon/ anesthesia feels like they are unable to be safely monitored.   Do not shave  48 hours prior to surgery.    Do not bring valuables to the hospital. [place].   Contacts, dentures or bridgework may not be worn into surgery.   Bring small overnight bag day of surgery.    Patients discharged on the day of surgery will not be allowed to drive home.   Special Instructions: Bring a copy of your healthcare power of attorney and living will documents         the day of surgery if you haven't scanned them before.  Please read over the following fact sheets you were given: IF YOU HAVE QUESTIONS ABOUT YOUR PRE-OP INSTRUCTIONS PLEASE CALL [phone]   [facility] - Preparing for Surgery Before surgery, you can play an important role.  Because skin is not sterile, your skin needs to  be as free of germs as possible.  You can reduce the number of germs on your skin by washing with CHG (chlorahexidine gluconate) soap before surgery.  CHG is an antiseptic cleaner which kills germs and bonds with the skin to continue killing germs even after washing. Please DO NOT use if you have an allergy to CHG or antibacterial soaps.  If your skin becomes reddened/irritated stop using the CHG and inform your nurse when you arrive at Short Stay. Do not shave (including legs and underarms) for at least 48 hours prior to the first CHG shower.  You may shave your face/neck. Please follow these instructions carefully:  1.  Shower with CHG Soap the night before surgery and the  morning of Surgery.  2.  If you choose to wash your hair, wash your hair first as usual with your  normal  shampoo.  3.  After you shampoo, rinse your hair and body thoroughly to remove the  shampoo.                           4.  Use CHG as you would any other liquid soap.  You can apply chg directly  to the skin and wash                       Gently with a scrungie or clean washcloth.  5.  Apply the CHG Soap to your body ONLY FROM THE NECK DOWN.   Do not use on face/ open                           Wound or open sores. Avoid contact with eyes, ears mouth and genitals (private parts).                       Wash face,  Genitals (private parts) with your normal soap.             6.  Wash thoroughly, paying special attention to the area where your surgery  will be performed.  7.  Thoroughly rinse your body with warm water from the neck down.  8.  DO NOT shower/wash with your normal soap after using and rinsing off  the CHG Soap.                9.  Pat yourself dry with a clean towel.            10.  Wear clean pajamas.            11.  Place clean sheets on your bed the night of your first shower and do not  sleep with pets. Day of Surgery : Do not apply any lotions/deodorants the morning of surgery.  Please wear clean clothes to  the hospital/surgery center.  FAILURE TO FOLLOW THESE INSTRUCTIONS MAY RESULT IN THE CANCELLATION OF YOUR SURGERY PATIENT SIGNATURE_________________________________  NURSE SIGNATURE__________________________________  ________________________________________________________________________

## 2021-06-20 NOTE — Progress Notes (Signed)
Pt. Needs orders for surgery.PAT and labs on 06/24/21.Thanks

## 2021-06-24 ENCOUNTER — Other Ambulatory Visit: Payer: Self-pay

## 2021-06-24 ENCOUNTER — Ambulatory Visit: Payer: Self-pay | Admitting: Physician Assistant

## 2021-06-24 ENCOUNTER — Encounter (HOSPITAL_COMMUNITY)
Admission: RE | Admit: 2021-06-24 | Discharge: 2021-06-24 | Disposition: A | Discharge status: Home / Self Care | Payer: Medicare Other | Source: Ambulatory Visit | Attending: Orthopedic Surgery | Admitting: Orthopedic Surgery

## 2021-06-24 ENCOUNTER — Encounter (HOSPITAL_COMMUNITY): Payer: Self-pay

## 2021-06-24 VITALS — BP 193/89 | HR 60 | Temp 98.4°F | Resp 18 | Ht 63.0 in | Wt 123.2 lb

## 2021-06-24 DIAGNOSIS — G5601 Carpal tunnel syndrome, right upper limb: Secondary | ICD-10-CM | POA: Diagnosis not present

## 2021-06-24 DIAGNOSIS — M75101 Unspecified rotator cuff tear or rupture of right shoulder, not specified as traumatic: Secondary | ICD-10-CM | POA: Diagnosis not present

## 2021-06-24 DIAGNOSIS — N189 Chronic kidney disease, unspecified: Secondary | ICD-10-CM | POA: Insufficient documentation

## 2021-06-24 DIAGNOSIS — Z79899 Other long term (current) drug therapy: Secondary | ICD-10-CM | POA: Diagnosis not present

## 2021-06-24 DIAGNOSIS — I129 Hypertensive chronic kidney disease with stage 1 through stage 4 chronic kidney disease, or unspecified chronic kidney disease: Secondary | ICD-10-CM | POA: Diagnosis not present

## 2021-06-24 DIAGNOSIS — K219 Gastro-esophageal reflux disease without esophagitis: Secondary | ICD-10-CM | POA: Insufficient documentation

## 2021-06-24 DIAGNOSIS — Z01812 Encounter for preprocedural laboratory examination: Secondary | ICD-10-CM | POA: Insufficient documentation

## 2021-06-24 DIAGNOSIS — A599 Trichomoniasis, unspecified: Secondary | ICD-10-CM | POA: Diagnosis not present

## 2021-06-24 DIAGNOSIS — Z792 Long term (current) use of antibiotics: Secondary | ICD-10-CM | POA: Diagnosis not present

## 2021-06-24 HISTORY — DX: Anemia, unspecified: D64.9

## 2021-06-24 HISTORY — DX: Chronic kidney disease, unspecified: N18.9

## 2021-06-24 HISTORY — DX: Malignant (primary) neoplasm, unspecified: C80.1

## 2021-06-24 LAB — BASIC METABOLIC PANEL
Anion gap: 7 (ref 5–15)
BUN: 10 mg/dL (ref 8–23)
CO2: 28 mmol/L (ref 22–32)
Calcium: 8.8 mg/dL — ABNORMAL LOW (ref 8.9–10.3)
Chloride: 105 mmol/L (ref 98–111)
Creatinine, Ser: 0.88 mg/dL (ref 0.44–1.00)
GFR, Estimated: >60 mL/min (ref >60)
Glucose, Bld: 83 mg/dL (ref 70–99)
Potassium: 3.6 mmol/L (ref 3.5–5.1)
Sodium: 140 mmol/L (ref 135–145)

## 2021-06-24 LAB — CBC
HCT: 34.5 % — ABNORMAL LOW (ref 36.0–46.0)
Hemoglobin: 11.6 g/dL — ABNORMAL LOW (ref 12.0–15.0)
MCH: 32 pg (ref 26.0–34.0)
MCHC: 33.6 g/dL (ref 30.0–36.0)
MCV: 95.3 fL (ref 80.0–100.0)
Platelets: 307 10*3/uL (ref 150–400)
RBC: 3.62 MIL/uL — ABNORMAL LOW (ref 3.87–5.11)
RDW: 13.8 % (ref 11.5–15.5)
WBC: 4 10*3/uL (ref 4.0–10.5)
nRBC: 0 % (ref 0.0–0.2)

## 2021-06-24 NOTE — H&P (View-Only) (Signed)
Janice Deleon is an 65 y.o. female.   Chief Complaint: right shoulder and hand pain HPI: pt presented to outpt clinic c/o R shoulder and R hand pain, NCV did confirm R carpal tunnel syndrome, R shoulder MRI confirms RTC tear does have an os acrominale, failed conservative treatments.  Past Medical History:  Diagnosis Date   Anemia    Cancer ([place])    Chronic kidney disease    GERD (gastroesophageal reflux disease)    Hypertension     Past Surgical History:  Procedure Laterality Date   ABDOMINAL HYSTERECTOMY     BOWEL RESECTION     colonscopy      at the age of 43   right nephrectomy       Family History  Problem Relation Age of Onset   Hypertension Mother    Diabetes Mother    Heart disease Mother    Hypertension Father    Cancer Father        colon cancer    Hypertension Sister    Diabetes Sister    Hypertension Brother    Diabetes Brother    Stroke Maternal Aunt    Social History:  reports that she has never smoked. She has never used smokeless tobacco. She reports current drug use. Drug: Marijuana. She reports that she does not drink alcohol.  Allergies:  Allergies  Allergen Reactions   Hydrocodone-Acetaminophen Itching    (Not in a hospital admission)   Results for orders placed or performed during the hospital encounter of 06/24/21 (from the past 48 hour(s))  CBC per protocol     Status: Abnormal   Collection Time: 06/24/21  8:57 AM  Result Value Ref Range   WBC 4.0 4.0 - 10.5 K/uL   RBC 3.62 (L) 3.87 - 5.11 MIL/uL   Hemoglobin 11.6 (L) 12.0 - 15.0 g/dL   HCT 34.5 (L) 36.0 - 46.0 %   MCV 95.3 80.0 - 100.0 fL   MCH 32.0 26.0 - 34.0 pg   MCHC 33.6 30.0 - 36.0 g/dL   RDW 13.8 11.5 - 15.5 %   Platelets 307 150 - 400 K/uL   nRBC 0.0 0.0 - 0.2 %    Comment: Performed at [facility], [place] [address] [place], [place] [address]  Basic metabolic panel per protocol     Status: Abnormal   Collection Time: 06/24/21  8:57 AM  Result  Value Ref Range   Sodium 140 135 - 145 mmol/L   Potassium 3.6 3.5 - 5.1 mmol/L   Chloride 105 98 - 111 mmol/L   CO2 28 22 - 32 mmol/L   Glucose, Bld 83 70 - 99 mg/dL    Comment: Glucose reference range applies only to samples taken after fasting for at least 8 hours.   BUN 10 8 - 23 mg/dL   Creatinine, Ser 0.88 0.44 - 1.00 mg/dL   Calcium 8.8 (L) 8.9 - 10.3 mg/dL   GFR, Estimated >60 >60 mL/min    Comment: (NOTE) Calculated using the CKD-EPI Creatinine Equation (2021)    Anion gap 7 5 - 15    Comment: Performed at [facility], [place] [address] [place], [place] [address]   No results found.  Review of Systems  Constitutional:  Positive for unexpected weight change.  Musculoskeletal:  Positive for arthralgias.  Neurological:  Positive for numbness.  All other systems reviewed and are negative.  Last menstrual period 08/31/1998. Physical Exam Constitutional:      Appearance: Normal  appearance.  HENT:     Head: Normocephalic and atraumatic.  Eyes:     Extraocular Movements: Extraocular movements intact.     Pupils: Pupils are equal, round, and reactive to light.  Cardiovascular:     Rate and Rhythm: Normal rate.     Pulses: Normal pulses.  Pulmonary:     Effort: Pulmonary effort is normal.  Abdominal:     General: Abdomen is flat. There is no distension.  Musculoskeletal:     Cervical back: Normal range of motion.     Comments: Patient is seated in exam room in no acute distress.  Alert and oriented.  Appears appropriate age. Examination of the right upper extremity shows she is neurovascularly intact.  She has painful arc of motion.  Limited range of motion of the right shoulder.  A lot of weakness secondary to pain with resisted abduction and rotation.  Positive impingement, Hawkins testing, hyperabduction of the arm across the chest.    shows positive Tinel's; positive Phalen's test right upper extremity.    Skin:    General: Skin is warm and dry.      Findings: No erythema or rash.  Neurological:     General: No focal deficit present.     Mental Status: She is alert.  Psychiatric:        Mood and Affect: Mood normal.        Behavior: Behavior normal.     Assessment/Plan Right carpal tunnel syndrome, right shoulder rotator cuff tear with os acrominale  Discussed risks and benefits of R carpal tunnel release and R shoulder scope debridement ?distal clavicle resection, open rotator cuff repair and she wishes to proceed.  We will set this up outpatient surgery.   Chriss Czar, PA-C 06/24/2021, 11:43 AM

## 2021-06-24 NOTE — Progress Notes (Signed)
COVID Vaccine Completed: Yes Date COVID Vaccine completed: 12/11/19 COVID vaccine manufacturer:  Moderna  x 2 COVID Test: N/A PCP - Cipriano Mile: NP Cardiologist -   Chest x-ray -  EKG - 04/14/21 Stress Test -  [place] -  Cardiac Cath -  Pacemaker/ICD device last checked:  Sleep Study -  CPAP -   Fasting Blood Sugar -  Checks Blood Sugar _____ times a day  Blood Thinner Instructions: Aspirin Instructions: Last Dose:  Anesthesia review: Hx: HTN  Patient denies shortness of breath, fever, cough and chest pain at PAT appointment   Patient verbalized understanding of instructions that were given to them at the PAT appointment. Patient was also instructed that they will need to review over the PAT instructions again at home before surgery.

## 2021-06-24 NOTE — H&P (Signed)
Janice Deleon is an 65 y.o. female.   Chief Complaint: right shoulder and hand pain HPI: pt presented to outpt clinic c/o R shoulder and R hand pain, NCV did confirm R carpal tunnel syndrome, R shoulder MRI confirms RTC tear does have an os acrominale, failed conservative treatments.  Past Medical History:  Diagnosis Date   Anemia    Cancer ([place])    Chronic kidney disease    GERD (gastroesophageal reflux disease)    Hypertension     Past Surgical History:  Procedure Laterality Date   ABDOMINAL HYSTERECTOMY     BOWEL RESECTION     colonscopy      at the age of 62   right nephrectomy       Family History  Problem Relation Age of Onset   Hypertension Mother    Diabetes Mother    Heart disease Mother    Hypertension Father    Cancer Father        colon cancer    Hypertension Sister    Diabetes Sister    Hypertension Brother    Diabetes Brother    Stroke Maternal Aunt    Social History:  reports that she has never smoked. She has never used smokeless tobacco. She reports current drug use. Drug: Marijuana. She reports that she does not drink alcohol.  Allergies:  Allergies  Allergen Reactions   Hydrocodone-Acetaminophen Itching    (Not in a hospital admission)   Results for orders placed or performed during the hospital encounter of 06/24/21 (from the past 48 hour(s))  CBC per protocol     Status: Abnormal   Collection Time: 06/24/21  8:57 AM  Result Value Ref Range   WBC 4.0 4.0 - 10.5 K/uL   RBC 3.62 (L) 3.87 - 5.11 MIL/uL   Hemoglobin 11.6 (L) 12.0 - 15.0 g/dL   HCT 34.5 (L) 36.0 - 46.0 %   MCV 95.3 80.0 - 100.0 fL   MCH 32.0 26.0 - 34.0 pg   MCHC 33.6 30.0 - 36.0 g/dL   RDW 13.8 11.5 - 15.5 %   Platelets 307 150 - 400 K/uL   nRBC 0.0 0.0 - 0.2 %    Comment: Performed at [facility], [place] [address], [place], [place] [address]  Basic metabolic panel per protocol     Status: Abnormal   Collection Time: 06/24/21  8:57 AM  Result  Value Ref Range   Sodium 140 135 - 145 mmol/L   Potassium 3.6 3.5 - 5.1 mmol/L   Chloride 105 98 - 111 mmol/L   CO2 28 22 - 32 mmol/L   Glucose, Bld 83 70 - 99 mg/dL    Comment: Glucose reference range applies only to samples taken after fasting for at least 8 hours.   BUN 10 8 - 23 mg/dL   Creatinine, Ser 0.88 0.44 - 1.00 mg/dL   Calcium 8.8 (L) 8.9 - 10.3 mg/dL   GFR, Estimated >60 >60 mL/min    Comment: (NOTE) Calculated using the CKD-EPI Creatinine Equation (2021)    Anion gap 7 5 - 15    Comment: Performed at [facility], [place] [address], [place], [place] [address]   No results found.  Review of Systems  Constitutional:  Positive for unexpected weight change.  Musculoskeletal:  Positive for arthralgias.  Neurological:  Positive for numbness.  All other systems reviewed and are negative.  Last menstrual period 08/31/1998. Physical Exam Constitutional:      Appearance: Normal  appearance.  HENT:     Head: Normocephalic and atraumatic.  Eyes:     Extraocular Movements: Extraocular movements intact.     Pupils: Pupils are equal, round, and reactive to light.  Cardiovascular:     Rate and Rhythm: Normal rate.     Pulses: Normal pulses.  Pulmonary:     Effort: Pulmonary effort is normal.  Abdominal:     General: Abdomen is flat. There is no distension.  Musculoskeletal:     Cervical back: Normal range of motion.     Comments: Patient is seated in exam room in no acute distress.  Alert and oriented.  Appears appropriate age. Examination of the right upper extremity shows she is neurovascularly intact.  She has painful arc of motion.  Limited range of motion of the right shoulder.  A lot of weakness secondary to pain with resisted abduction and rotation.  Positive impingement, Hawkins testing, hyperabduction of the arm across the chest.    shows positive Tinel's; positive Phalen's test right upper extremity.    Skin:    General: Skin is warm and dry.      Findings: No erythema or rash.  Neurological:     General: No focal deficit present.     Mental Status: She is alert.  Psychiatric:        Mood and Affect: Mood normal.        Behavior: Behavior normal.     Assessment/Plan Right carpal tunnel syndrome, right shoulder rotator cuff tear with os acrominale  Discussed risks and benefits of R carpal tunnel release and R shoulder scope debridement ?distal clavicle resection, open rotator cuff repair and she wishes to proceed.  We will set this up outpatient surgery.   Chriss Czar, PA-C 06/24/2021, 11:43 AM

## 2021-06-26 NOTE — Progress Notes (Addendum)
Anesthesia Chart Review   Case: [id number] Date/Time: 07/04/21 0715   Procedures:      SHOULDER ARTHROSCOPY WITH OPEN ROTATOR CUFF REPAIR AND DISTAL CLAVICLE ACROMINECTOMY, ACROMIOPLASTY (Right)     CARPAL TUNNEL RELEASE (Right)   Anesthesia type: General   Pre-op diagnosis: RIGHT SHOULDER ROTATOR CUFF TEAR, RT CARPAL TUNNEL   Location: [facility] ROOM 07 / [facility]   Surgeons: Earlie Server, MD       DISCUSSION:65 y.o. never smoker with h/o HTN, GERD, CKD, right shoulder rotator cuff tear scheduled for above procedure 07/04/2021 with Dr. Earlie Server.   Pt is not currently on HTN medications. Discussed with PCP and Dr. Alvester Morin office. Pt will need HTN optimization prior to surgery.    BP Readings from Last 3 Encounters:  06/24/21 (!) 193/89  04/06/21 (!) 202/87  07/11/18 (!) 134/94    Addendum:  Pt was started on Amlodipine 5mg  daily.  Evaluate BP DOS.  VS: BP (!) 193/89 Comment: RN Notify about B/P  Pulse 60   Temp 36.9 C (Oral)   Resp 18   Ht 5\' 3"  (1.6 m)   Wt 55.9 kg   LMP 08/31/1998   SpO2 99%   BMI 21.83 kg/m   PROVIDERS: Cipriano Mile, NP is PCP    LABS: Labs reviewed: Acceptable for surgery. (all labs ordered are listed, but only abnormal results are displayed)  Labs Reviewed  CBC - Abnormal; Notable for the following components:      Result Value   RBC 3.62 (*)    Hemoglobin 11.6 (*)    HCT 34.5 (*)    All other components within normal limits  BASIC METABOLIC PANEL - Abnormal; Notable for the following components:   Calcium 8.8 (*)    All other components within normal limits     IMAGES:   EKG: 04/14/2021 Rate 96 bpm  NSR  CV:  Past Medical History:  Diagnosis Date   Anemia    Cancer ([place])    Chronic kidney disease    GERD (gastroesophageal reflux disease)    Hypertension     Past Surgical History:  Procedure Laterality Date   ABDOMINAL HYSTERECTOMY     BOWEL RESECTION     colonscopy      at the age of 77   right nephrectomy        MEDICATIONS:  acetaminophen (TYLENOL) 500 MG tablet   doxycycline (VIBRAMYCIN) 100 MG capsule   HYDROmorphone (DILAUDID) 4 MG tablet   metroNIDAZOLE (FLAGYL) 500 MG tablet   omeprazole (PRILOSEC) 20 MG capsule   ondansetron (ZOFRAN ODT) 4 MG disintegrating tablet   No current facility-administered medications for this encounter.    Konrad Felix Ward, PA-C [facility] [phone]

## 2021-06-26 NOTE — Progress Notes (Signed)
Pt. Called to informed RN about new prescription for amlodipine 5 mg tab. Daily.Med req. Was updated and pt. Was instructed to take amlodipine 5 mg the morning of surgery with sip of water.

## 2021-07-04 ENCOUNTER — Ambulatory Visit (HOSPITAL_COMMUNITY): Payer: Medicare Other | Admitting: Certified Registered Nurse Anesthetist

## 2021-07-04 ENCOUNTER — Ambulatory Visit (HOSPITAL_COMMUNITY): Payer: Medicare Other | Admitting: Physician Assistant

## 2021-07-04 ENCOUNTER — Ambulatory Visit (HOSPITAL_COMMUNITY)
Admission: RE | Admit: 2021-07-04 | Discharge: 2021-07-04 | Disposition: A | Discharge status: Home / Self Care | Payer: Medicare Other | Attending: Orthopedic Surgery | Admitting: Orthopedic Surgery

## 2021-07-04 ENCOUNTER — Encounter (HOSPITAL_COMMUNITY)
Admission: RE | Disposition: A | Discharge status: Home / Self Care | Payer: Self-pay | Source: Home / Self Care | Attending: Orthopedic Surgery

## 2021-07-04 DIAGNOSIS — M75111 Incomplete rotator cuff tear or rupture of right shoulder, not specified as traumatic: Secondary | ICD-10-CM | POA: Diagnosis not present

## 2021-07-04 DIAGNOSIS — M25811 Other specified joint disorders, right shoulder: Secondary | ICD-10-CM | POA: Insufficient documentation

## 2021-07-04 DIAGNOSIS — I10 Essential (primary) hypertension: Secondary | ICD-10-CM | POA: Diagnosis not present

## 2021-07-04 DIAGNOSIS — K219 Gastro-esophageal reflux disease without esophagitis: Secondary | ICD-10-CM | POA: Diagnosis not present

## 2021-07-04 DIAGNOSIS — G5601 Carpal tunnel syndrome, right upper limb: Secondary | ICD-10-CM | POA: Insufficient documentation

## 2021-07-04 DIAGNOSIS — F1911 Other psychoactive substance abuse, in remission: Secondary | ICD-10-CM | POA: Diagnosis not present

## 2021-07-04 DIAGNOSIS — M19011 Primary osteoarthritis, right shoulder: Secondary | ICD-10-CM | POA: Diagnosis not present

## 2021-07-04 DIAGNOSIS — D649 Anemia, unspecified: Secondary | ICD-10-CM | POA: Insufficient documentation

## 2021-07-04 HISTORY — PX: CARPAL TUNNEL RELEASE: SHX101

## 2021-07-04 HISTORY — PX: SHOULDER ARTHROSCOPY WITH OPEN ROTATOR CUFF REPAIR AND DISTAL CLAVICLE ACROMINECTOMY: SHX5683

## 2021-07-04 LAB — VANCOMYCIN, RANDOM: Vancomycin Rm: <4

## 2021-07-04 LAB — DIGOXIN LEVEL: Digoxin Level: <0.2 ng/mL — ABNORMAL LOW (ref 0.8–2.0)

## 2021-07-04 LAB — ACETAMINOPHEN LEVEL: Acetaminophen (Tylenol), Serum: <10 ug/mL — ABNORMAL LOW (ref 10–30)

## 2021-07-04 LAB — SALICYLATE LEVEL: Salicylate Lvl: <7 mg/dL — ABNORMAL LOW (ref 7.0–30.0)

## 2021-07-04 LAB — ETHANOL: Alcohol, Ethyl (B): <10 mg/dL (ref <10)

## 2021-07-04 SURGERY — SHOULDER ARTHROSCOPY WITH OPEN ROTATOR CUFF REPAIR AND DISTAL CLAVICLE ACROMINECTOMY
Anesthesia: General | Site: Shoulder | Laterality: Right

## 2021-07-04 MED ORDER — ORAL CARE MOUTH RINSE: 15.0000 mL | Freq: Once | OROMUCOSAL | Status: AC

## 2021-07-04 MED ORDER — LACTATED RINGERS IR SOLN
Status: DC | PRN
  Administered 2021-07-04: 6000 mL

## 2021-07-04 MED ORDER — METHOCARBAMOL 500 MG PO TABS: 500.0000 mg | ORAL_TABLET | Freq: Four times a day (QID) | ORAL | 0 refills | Status: DC | PRN

## 2021-07-04 MED ORDER — BUPIVACAINE-EPINEPHRINE (PF) 0.5% -1:200000 IJ SOLN
INTRAMUSCULAR | Status: AC
  Filled 2021-07-04: qty 30

## 2021-07-04 MED ORDER — BUPIVACAINE HCL 0.25 % IJ SOLN
INTRAMUSCULAR | Status: AC
  Filled 2021-07-04: qty 1

## 2021-07-04 MED ORDER — OXYCODONE HCL 5 MG PO TABS: ORAL_TABLET | ORAL | 0 refills | Status: DC

## 2021-07-04 MED ORDER — DEXAMETHASONE SODIUM PHOSPHATE 10 MG/ML IJ SOLN
INTRAMUSCULAR | Status: DC | PRN
  Administered 2021-07-04: 5 mg via INTRAVENOUS

## 2021-07-04 MED ORDER — SUGAMMADEX SODIUM 200 MG/2ML IV SOLN
INTRAVENOUS | Status: DC | PRN
  Administered 2021-07-04: 250 mg via INTRAVENOUS

## 2021-07-04 MED ORDER — BUPIVACAINE HCL (PF) 0.5 % IJ SOLN
INTRAMUSCULAR | Status: DC | PRN
  Administered 2021-07-04: 15 mL

## 2021-07-04 MED ORDER — LIDOCAINE HCL (PF) 1 % IJ SOLN
INTRAMUSCULAR | Status: AC
  Filled 2021-07-04: qty 30

## 2021-07-04 MED ORDER — ROCURONIUM BROMIDE 10 MG/ML (PF) SYRINGE
PREFILLED_SYRINGE | INTRAVENOUS | Status: AC
  Filled 2021-07-04: qty 10

## 2021-07-04 MED ORDER — LIDOCAINE HCL (PF) 2 % IJ SOLN
INTRAMUSCULAR | Status: AC
  Filled 2021-07-04: qty 5

## 2021-07-04 MED ORDER — BUPIVACAINE LIPOSOME 1.3 % IJ SUSP
INTRAMUSCULAR | Status: DC | PRN
  Administered 2021-07-04: 10 mL via PERINEURAL

## 2021-07-04 MED ORDER — LIDOCAINE HCL (PF) 2 % IJ SOLN
INTRAMUSCULAR | Status: DC | PRN
  Administered 2021-07-04: 100 mg via INTRADERMAL

## 2021-07-04 MED ORDER — ONDANSETRON HCL 4 MG/2ML IJ SOLN
INTRAMUSCULAR | Status: DC | PRN
  Administered 2021-07-04: 4 mg via INTRAVENOUS

## 2021-07-04 MED ORDER — CHLORHEXIDINE GLUCONATE 0.12 % MT SOLN
15.0000 mL | Freq: Once | OROMUCOSAL | Status: AC
  Administered 2021-07-04: 15 mL via OROMUCOSAL

## 2021-07-04 MED ORDER — PHENYLEPHRINE 40 MCG/ML (10ML) SYRINGE FOR IV PUSH (FOR BLOOD PRESSURE SUPPORT)
PREFILLED_SYRINGE | INTRAVENOUS | Status: DC | PRN
  Administered 2021-07-04: 120 ug via INTRAVENOUS

## 2021-07-04 MED ORDER — KETOROLAC TROMETHAMINE 30 MG/ML IJ SOLN: 30.0000 mg | Freq: Once | INTRAMUSCULAR | Status: DC | PRN

## 2021-07-04 MED ORDER — PHENYLEPHRINE HCL-NACL 20-0.9 MG/250ML-% IV SOLN
INTRAVENOUS | Status: DC | PRN
Start: 2021-07-04 — End: 2021-07-04
  Administered 2021-07-04: 30 ug/min via INTRAVENOUS

## 2021-07-04 MED ORDER — FENTANYL CITRATE (PF) 100 MCG/2ML IJ SOLN
INTRAMUSCULAR | Status: AC
  Filled 2021-07-04: qty 2

## 2021-07-04 MED ORDER — EPINEPHRINE PF 1 MG/ML IJ SOLN
INTRAMUSCULAR | Status: AC
  Filled 2021-07-04: qty 1

## 2021-07-04 MED ORDER — ONDANSETRON HCL 4 MG/2ML IJ SOLN
INTRAMUSCULAR | Status: AC
  Filled 2021-07-04: qty 2

## 2021-07-04 MED ORDER — MIDAZOLAM HCL 5 MG/5ML IJ SOLN
INTRAMUSCULAR | Status: DC | PRN
  Administered 2021-07-04: 2 mg via INTRAVENOUS

## 2021-07-04 MED ORDER — PROPOFOL 10 MG/ML IV BOLUS
INTRAVENOUS | Status: DC | PRN
  Administered 2021-07-04: 130 mg via INTRAVENOUS

## 2021-07-04 MED ORDER — DEXAMETHASONE SODIUM PHOSPHATE 10 MG/ML IJ SOLN
INTRAMUSCULAR | Status: AC
  Filled 2021-07-04: qty 1

## 2021-07-04 MED ORDER — BUPIVACAINE HCL (PF) 0.5 % IJ SOLN
INTRAMUSCULAR | Status: AC
  Filled 2021-07-04: qty 30

## 2021-07-04 MED ORDER — BUPIVACAINE HCL (PF) 0.5 % IJ SOLN
INTRAMUSCULAR | Status: DC | PRN
  Administered 2021-07-04: 10 mL

## 2021-07-04 MED ORDER — MIDAZOLAM HCL 2 MG/2ML IJ SOLN
INTRAMUSCULAR | Status: AC
  Filled 2021-07-04: qty 2

## 2021-07-04 MED ORDER — FENTANYL CITRATE PF 50 MCG/ML IJ SOSY: 25.0000 ug | PREFILLED_SYRINGE | INTRAMUSCULAR | Status: DC | PRN

## 2021-07-04 MED ORDER — DEXMEDETOMIDINE (PRECEDEX) IN NS 20 MCG/5ML (4 MCG/ML) IV SYRINGE
PREFILLED_SYRINGE | INTRAVENOUS | Status: DC | PRN
  Administered 2021-07-04: 12 ug via INTRAVENOUS

## 2021-07-04 MED ORDER — ONDANSETRON HCL 4 MG/2ML IJ SOLN: 4.0000 mg | Freq: Once | INTRAMUSCULAR | Status: DC | PRN

## 2021-07-04 MED ORDER — SODIUM CHLORIDE 0.9 % IV SOLN: INTRAVENOUS | Status: DC

## 2021-07-04 MED ORDER — CEFAZOLIN SODIUM-DEXTROSE 2-4 GM/100ML-% IV SOLN
2.0000 g | INTRAVENOUS | Status: AC
Start: 2021-07-04 — End: 2021-07-04
  Administered 2021-07-04: 2 g via INTRAVENOUS
  Filled 2021-07-04: qty 100

## 2021-07-04 MED ORDER — PHENYLEPHRINE HCL (PRESSORS) 10 MG/ML IV SOLN
INTRAVENOUS | Status: AC
  Filled 2021-07-04: qty 2

## 2021-07-04 MED ORDER — PROPOFOL 10 MG/ML IV BOLUS
INTRAVENOUS | Status: AC
  Filled 2021-07-04: qty 20

## 2021-07-04 MED ORDER — LACTATED RINGERS IV SOLN: INTRAVENOUS | Status: DC

## 2021-07-04 MED ORDER — FENTANYL CITRATE (PF) 100 MCG/2ML IJ SOLN
INTRAMUSCULAR | Status: DC | PRN
  Administered 2021-07-04 (×2): 50 ug via INTRAVENOUS

## 2021-07-04 MED ORDER — ROCURONIUM BROMIDE 10 MG/ML (PF) SYRINGE
PREFILLED_SYRINGE | INTRAVENOUS | Status: DC | PRN
  Administered 2021-07-04: 70 mg via INTRAVENOUS

## 2021-07-04 SURGICAL SUPPLY — 84 items
AID PSTN UNV HD RSTRNT DISP (MISCELLANEOUS)
APL SKNCLS STERI-STRIP NONHPOA (GAUZE/BANDAGES/DRESSINGS) ×2
BAG COUNTER SPONGE SURGICOUNT (BAG) IMPLANT
BAG SPNG CNTER NS LX DISP (BAG)
BENZOIN TINCTURE PRP APPL 2/3 (GAUZE/BANDAGES/DRESSINGS) ×3 IMPLANT
BLADE AVERAGE 25X9 (BLADE) IMPLANT
BLADE SURG SZ11 CARB STEEL (BLADE) ×3 IMPLANT
BNDG CMPR 9X4 STRL LF SNTH (GAUZE/BANDAGES/DRESSINGS) ×2
BNDG COHESIVE 4X5 TAN ST LF (GAUZE/BANDAGES/DRESSINGS) ×3 IMPLANT
BNDG CONFORM 3 STRL LF (GAUZE/BANDAGES/DRESSINGS) ×3 IMPLANT
BNDG ELASTIC 3X5.8 VLCR STR LF (GAUZE/BANDAGES/DRESSINGS) ×3 IMPLANT
BNDG ELASTIC 4X5.8 VLCR STR LF (GAUZE/BANDAGES/DRESSINGS) ×1 IMPLANT
BNDG ESMARK 4X9 LF (GAUZE/BANDAGES/DRESSINGS) ×3 IMPLANT
BUR OVAL CARBIDE 4.0 (BURR) IMPLANT
BURR OVAL 8 FLU 4.0X13 (MISCELLANEOUS) ×1 IMPLANT
BURR OVAL 8 FLU 5.0X13 (MISCELLANEOUS) IMPLANT
CANNULA 5.75X7 CRYSTAL CLEAR (CANNULA) ×4 IMPLANT
CORD BIPOLAR FORCEPS 12FT (ELECTRODE) ×3 IMPLANT
COVER SURGICAL LIGHT HANDLE (MISCELLANEOUS) ×3 IMPLANT
CUFF TOURN SGL QUICK 18X4 (TOURNIQUET CUFF) IMPLANT
CUFF TOURN SGL QUICK 24 (TOURNIQUET CUFF)
CUFF TRNQT CYL 24X4X16.5-23 (TOURNIQUET CUFF) IMPLANT
DECANTER SPIKE VIAL GLASS SM (MISCELLANEOUS) ×3 IMPLANT
DISSECTOR 4.0MM X 13CM (MISCELLANEOUS) ×3 IMPLANT
DRAPE ORTHO SPLIT 77X108 STRL (DRAPES)
DRAPE POUCH INSTRU U-SHP 10X18 (DRAPES) ×3 IMPLANT
DRAPE STERI 35X30 U-POUCH (DRAPES) ×3 IMPLANT
DRAPE SURG 17X11 SM STRL (DRAPES) ×3 IMPLANT
DRAPE SURG ORHT 6 SPLT 77X108 (DRAPES) IMPLANT
DRAPE U-SHAPE 47X51 STRL (DRAPES) ×3 IMPLANT
DRSG EMULSION OIL 3X3 NADH (GAUZE/BANDAGES/DRESSINGS) ×4 IMPLANT
DRSG PAD ABDOMINAL 8X10 ST (GAUZE/BANDAGES/DRESSINGS) ×3 IMPLANT
DURAPREP 26ML APPLICATOR (WOUND CARE) ×3 IMPLANT
EXCALIBUR 3.8MM X 13CM (MISCELLANEOUS) ×3 IMPLANT
GAUZE SPONGE 4X4 12PLY STRL (GAUZE/BANDAGES/DRESSINGS) ×4 IMPLANT
GLOVE SRG 8 PF TXTR STRL LF DI (GLOVE) ×4 IMPLANT
GLOVE SURG ENC MOIS LTX SZ7.5 (GLOVE) ×3 IMPLANT
GLOVE SURG ORTHO LTX SZ8 (GLOVE) ×3 IMPLANT
GLOVE SURG POLYISO LF SZ7.5 (GLOVE) ×3 IMPLANT
GLOVE SURG UNDER POLY LF SZ8 (GLOVE) ×6
GOWN STRL REUS W/TWL XL LVL3 (GOWN DISPOSABLE) ×6 IMPLANT
KIT BASIN OR (CUSTOM PROCEDURE TRAY) ×3 IMPLANT
KIT TURNOVER KIT A (KITS) IMPLANT
MANIFOLD NEPTUNE II (INSTRUMENTS) ×6 IMPLANT
NDL HYPO 21X1.5 SAFETY (NEEDLE) ×2 IMPLANT
NDL SCORPION MULTI FIRE (NEEDLE) IMPLANT
NDL SPNL 18GX3.5 QUINCKE PK (NEEDLE) ×2 IMPLANT
NEEDLE HYPO 21X1.5 SAFETY (NEEDLE) ×3 IMPLANT
NEEDLE HYPO 22GX1.5 SAFETY (NEEDLE) ×3 IMPLANT
NEEDLE SCORPION MULTI FIRE (NEEDLE) IMPLANT
NEEDLE SPNL 18GX3.5 QUINCKE PK (NEEDLE) ×3 IMPLANT
NS IRRIG 1000ML POUR BTL (IV SOLUTION) ×3 IMPLANT
PACK ORTHO EXTREMITY (CUSTOM PROCEDURE TRAY) ×3 IMPLANT
PACK SHOULDER (CUSTOM PROCEDURE TRAY) ×3 IMPLANT
PAD CAST 3X4 CTTN HI CHSV (CAST SUPPLIES) ×2 IMPLANT
PADDING CAST COTTON 3X4 STRL (CAST SUPPLIES) ×3
PENCIL SMOKE EVACUATOR (MISCELLANEOUS) IMPLANT
PORT APPOLLO RF 90DEGREE MULTI (SURGICAL WAND) ×1 IMPLANT
PROTECTOR NERVE ULNAR (MISCELLANEOUS) ×3 IMPLANT
RESTRAINT HEAD UNIVERSAL NS (MISCELLANEOUS) IMPLANT
SLING ARM IMMOBILIZER LRG (SOFTGOODS) IMPLANT
SLING ARM IMMOBILIZER MED (SOFTGOODS) ×1 IMPLANT
STRIP CLOSURE SKIN 1/2X4 (GAUZE/BANDAGES/DRESSINGS) ×3 IMPLANT
SUCTION FRAZIER HANDLE 12FR (TUBING) ×3
SUCTION TUBE FRAZIER 12FR DISP (TUBING) ×2 IMPLANT
SUT BONE WAX W31G (SUTURE) IMPLANT
SUT ETHIBOND NAB CT1 #1 30IN (SUTURE) ×3 IMPLANT
SUT ETHILON 3 0 PS 1 (SUTURE) ×3 IMPLANT
SUT ETHILON 4 0 PS 2 18 (SUTURE) ×3 IMPLANT
SUT FIBERWIRE #2 38 T-5 BLUE (SUTURE)
SUT MNCRL AB 3-0 PS2 18 (SUTURE) ×3 IMPLANT
SUT TIGER TAPE 7 IN WHITE (SUTURE) IMPLANT
SUT VIC AB 0 CT1 27 (SUTURE)
SUT VIC AB 0 CT1 27XBRD ANTBC (SUTURE) IMPLANT
SUT VIC AB 1 CT1 36 (SUTURE) ×3 IMPLANT
SUT VIC AB 2-0 CT1 27 (SUTURE)
SUT VIC AB 2-0 CT1 TAPERPNT 27 (SUTURE) IMPLANT
SUTURE FIBERWR #2 38 T-5 BLUE (SUTURE) IMPLANT
SYR 20ML LL LF (SYRINGE) ×3 IMPLANT
SYR CONTROL 10ML LL (SYRINGE) ×3 IMPLANT
TAPE FIBER 2MM 7IN #2 BLUE (SUTURE) IMPLANT
TOWEL OR 17X26 10 PK STRL BLUE (TOWEL DISPOSABLE) ×6 IMPLANT
TUBING ARTHROSCOPY IRRIG 16FT (MISCELLANEOUS) ×3 IMPLANT
TUBING CONNECTING 10 (TUBING) ×3 IMPLANT

## 2021-07-04 NOTE — Interval H&P Note (Signed)
History and Physical Interval Note:  07/04/2021 7:33 AM  Janice Deleon  has presented today for surgery, with the diagnosis of RIGHT SHOULDER ROTATOR CUFF TEAR, RT CARPAL TUNNEL.  The various methods of treatment have been discussed with the patient and family. After consideration of risks, benefits and other options for treatment, the patient has consented to  Procedure(s): SHOULDER ARTHROSCOPY WITH OPEN ROTATOR CUFF REPAIR AND DISTAL CLAVICLE ACROMINECTOMY, ACROMIOPLASTY (Right) CARPAL TUNNEL RELEASE (Right) as a surgical intervention.  The patient's history has been reviewed, patient examined, no change in status, stable for surgery.  I have reviewed the patient's chart and labs.  Questions were answered to the patient's satisfaction.     Yvette Rack

## 2021-07-04 NOTE — Transfer of Care (Signed)
Immediate Anesthesia Transfer of Care Note  Patient: Janice Deleon  Procedure(s) Performed: SHOULDER ARTHROSCOPY WITH DEBRIDEMENT SUBACROMIAL DECOMPRESSION (Right: Shoulder) CARPAL TUNNEL RELEASE (Right: Hand)  Patient Location: PACU  Anesthesia Type:General  Level of Consciousness: awake, alert  and oriented  Airway & Oxygen Therapy: Patient Spontanous Breathing and Patient connected to face mask  Post-op Assessment: Report given to RN and Post -op Vital signs reviewed and stable  Post vital signs: Reviewed and stable  Last Vitals:  Vitals Value Taken Time  BP 146/87 07/04/21 0918  Temp    Pulse 59 07/04/21 0922  Resp 10 07/04/21 0922  SpO2 100 % 07/04/21 0922  Vitals shown include unvalidated device data.  Last Pain:  Vitals:   07/04/21 0542  TempSrc:   PainSc: 8       Patients Stated Pain Goal: 5 (08/81/10 3159)  Complications: No notable events documented.

## 2021-07-04 NOTE — Anesthesia Postprocedure Evaluation (Signed)
Anesthesia Post Note  Patient: Janice Deleon  Procedure(s) Performed: SHOULDER ARTHROSCOPY WITH DEBRIDEMENT SUBACROMIAL DECOMPRESSION (Right: Shoulder) CARPAL TUNNEL RELEASE (Right: Hand)     Patient location during evaluation: PACU Anesthesia Type: General Level of consciousness: awake and alert Pain management: pain level controlled Vital Signs Assessment: post-procedure vital signs reviewed and stable Respiratory status: spontaneous breathing, nonlabored ventilation, respiratory function stable and patient connected to nasal cannula oxygen Cardiovascular status: blood pressure returned to baseline and stable Postop Assessment: no apparent nausea or vomiting Anesthetic complications: no   No notable events documented.  Last Vitals:  Vitals:   07/04/21 0945 07/04/21 1000  BP: (!) 170/85 (!) 160/92  Pulse: 62   Resp: 10 14  Temp:    SpO2: 100%     Last Pain:  Vitals:   07/04/21 1015  TempSrc:   PainSc: 0-No pain                 [name] S

## 2021-07-04 NOTE — Anesthesia Procedure Notes (Signed)
Procedure Name: Intubation Date/Time: 07/04/2021 7:53 AM Performed by: Rosaland Lao, CRNA Pre-anesthesia Checklist: Patient identified, Emergency Drugs available, Suction available and Patient being monitored Patient Re-evaluated:Patient Re-evaluated prior to induction Oxygen Delivery Method: Circle system utilized Preoxygenation: Pre-oxygenation with 100% oxygen Induction Type: IV induction Ventilation: Mask ventilation without difficulty Laryngoscope Size: Miller and 2 Grade View: Grade I Tube type: Oral Tube size: 7.0 mm Number of attempts: 1 Airway Equipment and Method: Stylet Placement Confirmation: ETT inserted through vocal cords under direct vision, positive ETCO2 and breath sounds checked- equal and bilateral Secured at: 22 cm Tube secured with: Tape Dental Injury: Teeth and Oropharynx as per pre-operative assessment

## 2021-07-04 NOTE — Anesthesia Procedure Notes (Signed)
Anesthesia Regional Block: Interscalene brachial plexus block   Pre-Anesthetic Checklist: , timeout performed,  Correct Patient, Correct Site, Correct Laterality,  Correct Procedure, Correct Position, site marked,  Risks and benefits discussed,  Surgical consent,  Pre-op evaluation,  At surgeon's request and post-op pain management  Laterality: Right  Prep: chloraprep       Needles:  Injection technique: Single-shot  Needle Type: Echogenic Needle     Needle Length: 9cm      Additional Needles:   Procedures:,,,, ultrasound used (permanent image in chart),,    Narrative:  Start time: 07/04/2021 6:47 AM End time: 07/04/2021 6:54 AM Injection made incrementally with aspirations every 5 mL.  Performed by: Personally  Anesthesiologist: Myrtie Soman, MD  Additional Notes: Patient tolerated the procedure well without complications

## 2021-07-04 NOTE — Discharge Instructions (Signed)
Diet: As you were doing prior to hospitalization   Activity: Increase activity slowly as tolerated  No lifting or driving for 48 hours.  Discontinue sling in 24-48hrs.  Encourage early range of motion with the shoulder as tolerated.  Shower: May shower without a dressing on post op day #5, NO SOAKING in tub   Dressing: You may change your dressing on post op day #3.  Then change the dressing daily with sterile 4"x4"s gauze dressing   Weight Bearing: no heavy lifting with operative arm and hand.  To prevent constipation: you may use a stool softener such as -  Colace ( over the counter) 100 mg by mouth twice a day  Drink plenty of fluids ( prune juice may be helpful) and high fiber foods  Miralax ( over the counter) for constipation as needed.   Precautions: If you experience chest pain or shortness of breath - call 911 immediately For transfer to the hospital emergency department!!  If you develop a fever greater that 101 F, purulent drainage from wound, increased redness or drainage from wound, or calf pain -- Call the office   Follow- Up Appointment: Please call for an appointment to be seen on 07/17/21 [facility] - [phone]

## 2021-07-04 NOTE — Brief Op Note (Signed)
07/04/2021  9:12 AM  PATIENT:  Janice Deleon  65 y.o. female  PRE-OPERATIVE DIAGNOSIS:  RIGHT SHOULDER ROTATOR CUFF TEAR, RT CARPAL TUNNEL  POST-OPERATIVE DIAGNOSIS:  RIGHT SHOULDER ROTATOR CUFF TEAR, RT CARPAL TUNNEL  PROCEDURE:  Procedure(s): SHOULDER ARTHROSCOPY WITH DEBRIDEMENT SUBACROMIAL DECOMPRESSION (Right) CARPAL TUNNEL RELEASE (Right)  SURGEON:  Surgeon(s) and Role:    Earlie Server, MD - Primary  PHYSICIAN ASSISTANT: Chriss Czar, PA-C  ASSISTANTS:    ANESTHESIA:   local, regional, and general  EBL:  20 mL   BLOOD ADMINISTERED:none  DRAINS: none   LOCAL MEDICATIONS USED:  MARCAINE     SPECIMEN:  No Specimen  DISPOSITION OF SPECIMEN:  N/A  COUNTS:  YES  TOURNIQUET:   Total Tourniquet Time Documented: Forearm (Right) - 12 minutes Total: Forearm (Right) - 12 minutes   DICTATION: .Other Dictation: Dictation Number unknown  PLAN OF CARE: Discharge to home after PACU  PATIENT DISPOSITION:  PACU - hemodynamically stable.   Delay start of Pharmacological VTE agent (>24hrs) due to surgical blood loss or risk of bleeding: not applicable

## 2021-07-04 NOTE — Anesthesia Procedure Notes (Signed)
Anesthesia Procedure Image    

## 2021-07-04 NOTE — Anesthesia Preprocedure Evaluation (Signed)
Anesthesia Evaluation  Patient identified by MRN, date of birth, ID band Patient awake    Reviewed: Allergy & Precautions, NPO status , Patient's Chart, lab work & pertinent test results  Airway Mallampati: II  TM Distance: >3 FB Neck ROM: Full    Dental no notable dental hx.    Pulmonary neg pulmonary ROS,    Pulmonary exam normal breath sounds clear to auscultation       Cardiovascular hypertension, Normal cardiovascular exam Rhythm:Regular Rate:Normal     Neuro/Psych negative neurological ROS  negative psych ROS   GI/Hepatic GERD  ,(+)     substance abuse  cocaine use,   Endo/Other  negative endocrine ROS  Renal/GU negative Renal ROS  negative genitourinary   Musculoskeletal negative musculoskeletal ROS (+)   Abdominal   Peds negative pediatric ROS (+)  Hematology  (+) anemia ,   Anesthesia Other Findings   Reproductive/Obstetrics negative OB ROS                             Anesthesia Physical Anesthesia Plan  ASA: 3  Anesthesia Plan: General   Post-op Pain Management:  Regional for Post-op pain   Induction: Intravenous  PONV Risk Score and Plan: 3 and Ondansetron, Dexamethasone and Treatment may vary due to age or medical condition  Airway Management Planned: Oral ETT  Additional Equipment:   Intra-op Plan:   Post-operative Plan: Extubation in OR  Informed Consent: I have reviewed the patients History and Physical, chart, labs and discussed the procedure including the risks, benefits and alternatives for the proposed anesthesia with the patient or authorized representative who has indicated his/her understanding and acceptance.     Dental advisory given  Plan Discussed with: CRNA and Surgeon  Anesthesia Plan Comments:         Anesthesia Quick Evaluation

## 2021-07-05 ENCOUNTER — Encounter (HOSPITAL_COMMUNITY): Payer: Self-pay | Admitting: Orthopedic Surgery

## 2021-07-05 NOTE — Op Note (Signed)
Janice Deleon, Janice Deleon MEDICAL RECORD NO: [id number] ACCOUNT NO: [id number] DATE OF BIRTH: [birth year] FACILITY: [facility] LOCATION: [facility] PHYSICIAN: W D. Valeta Harms., MD  Operative Report   DATE OF PROCEDURE: 07/04/2021  PREOPERATIVE DIAGNOSES:   1.  Right carpal tunnel syndrome. 2.  Impingement, right shoulder. 3.  Degenerative tearing of the anterior superior labrum. 4.  Partial rotator cuff tear. 5.  Mild glenohumeral arthritis.  PROCEDURE:   1.  Right carpal tunnel release. 2.  Right shoulder debridement (extensive). 3.  Right shoulder subacromial decompression.  SURGEON: W D. Valeta Harms., MD  ASSISTANTMarjo Bicker.    General with block.    TOURNIQUET TIME:  Carpal tunnel 12 minutes.  DESCRIPTION OF PROCEDURE:  Examination under anesthesia showed normal range of motion of the shoulder.  We initially approached the hand with the carpal tunnel release, applied a sterile tourniquet, exsanguinated the hand and inflated the forearm  tourniquet to 250.  A curvilinear incision was made just ulnar to the thenar crease.  We dissected down through the palmar fascia on the ulnar side of the transverse carpal ligament.  The ligament was released.  The nerve showed moderate compression.   The wound was irrigated, closed with nylon and the incision infiltrated with 10 mL of 0.5% plain Marcaine.  Shoulder was approached through anterior, posterior and lateral portal.  Systematic inspection intraarticularly showed a partial thickness tear of the supraspinatus tendon estimated to be about 30% of thickness and about a third of the width.  This was  debrided.  Degenerative tearing of the anterior superior labrum was debrided as well with the biceps tendon anchor to be intact.  There was mild glenoid degenerative change that was debrided.  Again, extensive debridement intraarticularly was carried out  of the labrum, glenohumeral joint and the rotator cuff.  The subacromial space was hypertrophied  and inflamed.  Bursectomy was carried out, release of the CA ligament as well as an acromioplasty.  No full thickness rotator cuff tear was appreciated.   Portals were closed with nylon.  Sterile dressings were placed on the shoulder and hand and a sling on the shoulder.  Taken to recovery room in stable condition.   Elián.Darby D: 07/04/2021 8:58:22 am T: 07/05/2021 12:02:00 am  JOB: [id number]/ [id number]

## 2021-08-28 ENCOUNTER — Other Ambulatory Visit: Payer: Self-pay | Admitting: Student

## 2021-08-28 DIAGNOSIS — R0989 Other specified symptoms and signs involving the circulatory and respiratory systems: Secondary | ICD-10-CM

## 2022-02-23 ENCOUNTER — Other Ambulatory Visit: Payer: Self-pay | Admitting: Student

## 2022-02-23 DIAGNOSIS — Z8619 Personal history of other infectious and parasitic diseases: Secondary | ICD-10-CM

## 2022-02-23 DIAGNOSIS — R1084 Generalized abdominal pain: Secondary | ICD-10-CM

## 2022-02-23 DIAGNOSIS — Z1231 Encounter for screening mammogram for malignant neoplasm of breast: Secondary | ICD-10-CM

## 2022-02-26 ENCOUNTER — Emergency Department (HOSPITAL_COMMUNITY): Payer: Medicare Other

## 2022-02-26 ENCOUNTER — Other Ambulatory Visit: Payer: Self-pay

## 2022-02-26 ENCOUNTER — Encounter (HOSPITAL_COMMUNITY): Payer: Self-pay

## 2022-02-26 ENCOUNTER — Emergency Department (HOSPITAL_COMMUNITY)
Admission: EM | Admit: 2022-02-26 | Discharge: 2022-02-26 | Disposition: A | Discharge status: Home / Self Care | Payer: Medicare Other | Attending: Emergency Medicine | Admitting: Emergency Medicine

## 2022-02-26 DIAGNOSIS — R1084 Generalized abdominal pain: Secondary | ICD-10-CM | POA: Diagnosis present

## 2022-02-26 DIAGNOSIS — R110 Nausea: Secondary | ICD-10-CM | POA: Diagnosis not present

## 2022-02-26 DIAGNOSIS — R1013 Epigastric pain: Secondary | ICD-10-CM | POA: Diagnosis not present

## 2022-02-26 DIAGNOSIS — Z8541 Personal history of malignant neoplasm of cervix uteri: Secondary | ICD-10-CM | POA: Diagnosis not present

## 2022-02-26 LAB — URINALYSIS, ROUTINE W REFLEX MICROSCOPIC
Bacteria, UA: NONE SEEN
Bilirubin Urine: NEGATIVE
Glucose, UA: NEGATIVE mg/dL
Ketones, ur: NEGATIVE mg/dL
Nitrite: NEGATIVE
Protein, ur: 100 mg/dL — AB
Specific Gravity, Urine: 1.018 (ref 1.005–1.030)
pH: 5 (ref 5.0–8.0)

## 2022-02-26 LAB — COMPREHENSIVE METABOLIC PANEL
ALT: 8 U/L (ref 0–44)
AST: 11 U/L — ABNORMAL LOW (ref 15–41)
Albumin: 4.2 g/dL (ref 3.5–5.0)
Alkaline Phosphatase: 68 U/L (ref 38–126)
Anion gap: 10 (ref 5–15)
BUN: 13 mg/dL (ref 8–23)
CO2: 29 mmol/L (ref 22–32)
Calcium: 9.6 mg/dL (ref 8.9–10.3)
Chloride: 99 mmol/L (ref 98–111)
Creatinine, Ser: 0.99 mg/dL (ref 0.44–1.00)
GFR, Estimated: >60 mL/min (ref >60)
Glucose, Bld: 111 mg/dL — ABNORMAL HIGH (ref 70–99)
Potassium: 3.6 mmol/L (ref 3.5–5.1)
Sodium: 138 mmol/L (ref 135–145)
Total Bilirubin: 0.6 mg/dL (ref 0.3–1.2)
Total Protein: 8.2 g/dL — ABNORMAL HIGH (ref 6.5–8.1)

## 2022-02-26 LAB — LIPASE, BLOOD: Lipase: 22 U/L (ref 11–51)

## 2022-02-26 LAB — CBC
HCT: 38.9 % (ref 36.0–46.0)
Hemoglobin: 12.8 g/dL (ref 12.0–15.0)
MCH: 31.2 pg (ref 26.0–34.0)
MCHC: 32.9 g/dL (ref 30.0–36.0)
MCV: 94.9 fL (ref 80.0–100.0)
Platelets: 435 10*3/uL — ABNORMAL HIGH (ref 150–400)
RBC: 4.1 MIL/uL (ref 3.87–5.11)
RDW: 13.3 % (ref 11.5–15.5)
WBC: 4.1 10*3/uL (ref 4.0–10.5)
nRBC: 0 % (ref 0.0–0.2)

## 2022-02-26 LAB — LACTIC ACID, PLASMA: Lactic Acid, Venous: 0.9 mmol/L (ref 0.5–1.9)

## 2022-02-26 LAB — TROPONIN I (HIGH SENSITIVITY): Troponin I (High Sensitivity): 3 ng/L (ref <18)

## 2022-02-26 MED ORDER — ALUM & MAG HYDROXIDE-SIMETH 200-200-20 MG/5ML PO SUSP
30.0000 mL | Freq: Once | ORAL | Status: AC
  Administered 2022-02-26: 30 mL via ORAL
  Filled 2022-02-26: qty 30

## 2022-02-26 MED ORDER — OMEPRAZOLE 20 MG PO CPDR: 20.0000 mg | DELAYED_RELEASE_CAPSULE | Freq: Every day | ORAL | 0 refills | Status: DC

## 2022-02-26 MED ORDER — SODIUM CHLORIDE 0.9 % IV BOLUS
1000.0000 mL | Freq: Once | INTRAVENOUS | Status: AC
Start: 2022-02-26 — End: 2022-02-26
  Administered 2022-02-26: 1000 mL via INTRAVENOUS

## 2022-02-26 MED ORDER — ONDANSETRON HCL 4 MG/2ML IJ SOLN
4.0000 mg | Freq: Once | INTRAMUSCULAR | Status: AC
  Administered 2022-02-26: 4 mg via INTRAVENOUS
  Filled 2022-02-26: qty 2

## 2022-02-26 MED ORDER — SODIUM CHLORIDE (PF) 0.9 % IJ SOLN
INTRAMUSCULAR | Status: AC
  Filled 2022-02-26: qty 50

## 2022-02-26 MED ORDER — IOHEXOL 300 MG/ML  SOLN
100.0000 mL | Freq: Once | INTRAMUSCULAR | Status: AC | PRN
  Administered 2022-02-26: 100 mL via INTRAVENOUS

## 2022-02-26 MED ORDER — LIDOCAINE VISCOUS HCL 2 % MT SOLN
15.0000 mL | Freq: Once | OROMUCOSAL | Status: AC
  Administered 2022-02-26: 15 mL via ORAL
  Filled 2022-02-26: qty 15

## 2022-02-26 NOTE — ED Provider Notes (Signed)
Janice Deleon   CSN: [id number] Arrival date & time: 02/26/22  1054     History  Chief Complaint  Patient presents with   Abdominal Pain    Janice Deleon is a 66 y.o. female.  Patient with a history of uterine cancer status post hysterectomy, nephrectomy, bowel resection presenting with diffuse abdominal pain.  States has had consistent abdominal pain since 2003 due to scar tissue from her surgeries.  Over the past several months the pain has gotten progressively worse through in her upper abdomen and causing her pain with eating to the point when she does not want to eat or drink.  She still has an appetite and has not had any vomiting but does have nausea.  Normal bowel movements.  No pain with urination or blood in the urine.  No fever.  She saw her PCP recently is being referred for some tests but is frustrated by the pain and wants to know why she is having pain and not able to eat.  She is never seen a GI doctor or had an endoscopy. The pain is sharp in her upper abdomen and spreads diffusely. It is worse when she tries to eat  The history is provided by the patient.  Abdominal Pain Associated symptoms: nausea   Associated symptoms: no cough, no dysuria, no fever, no hematuria, no shortness of breath and no vomiting        Home Medications Prior to Admission medications   Medication Sig Start Date End Date Taking? Authorizing Provider  acetaminophen (TYLENOL) 500 MG tablet Take 1,000 mg by mouth every 6 (six) hours as needed for moderate pain.    [provider]  amLODipine (NORVASC) 5 MG tablet Take 5 mg by mouth daily.    [provider]      Allergies    Hydrocodone-acetaminophen    Review of Systems   Review of Systems  Constitutional:  Positive for activity change and appetite change. Negative for fever.  HENT:  Negative for congestion and rhinorrhea.   Respiratory:  Negative for cough, chest  tightness and shortness of breath.   Gastrointestinal:  Positive for abdominal pain and nausea. Negative for vomiting.  Genitourinary:  Negative for dysuria and hematuria.  Musculoskeletal:  Negative for arthralgias and myalgias.  Skin:  Negative for rash.  Neurological:  Negative for dizziness, weakness and headaches.   all other systems are negative except as noted in the HPI and PMH.    Physical Exam Updated Vital Signs BP (!) 172/95 (BP Location: Right Arm)   Pulse 90   Temp 98 F (36.7 C) (Oral)   Resp 18   Ht '5\' 3"'$  (1.6 m)   Wt 54 kg   LMP 08/31/1998   SpO2 99%   BMI 21.08 kg/m  Physical Exam Vitals and nursing Deleon reviewed.  Constitutional:      General: She is not in acute distress.    Appearance: She is well-developed.  HENT:     Head: Normocephalic and atraumatic.     Mouth/Throat:     Pharynx: No oropharyngeal exudate.  Eyes:     Conjunctiva/sclera: Conjunctivae normal.     Pupils: Pupils are equal, round, and reactive to light.  Neck:     Comments: No meningismus. Cardiovascular:     Rate and Rhythm: Normal rate and regular rhythm.     Heart sounds: Normal heart sounds. No murmur heard. Pulmonary:     Effort: Pulmonary effort is  normal. No respiratory distress.     Breath sounds: Normal breath sounds.  Abdominal:     Palpations: Abdomen is soft.     Tenderness: There is abdominal tenderness. There is no guarding or rebound.     Comments: Diffuse tenderness, worse in the epigastrium.  No guarding or rebound No significant right upper quadrant tenderness  Musculoskeletal:        General: No tenderness. Normal range of motion.     Cervical back: Normal range of motion and neck supple.  Skin:    General: Skin is warm.  Neurological:     Mental Status: She is alert and oriented to person, place, and time.     Cranial Nerves: No cranial nerve deficit.     Motor: No abnormal muscle tone.     Coordination: Coordination normal.     Comments:  5/5 strength  throughout. CN 2-12 intact.Equal grip strength.   Psychiatric:        Behavior: Behavior normal.     ED Results / Procedures / Treatments   Labs (all labs ordered are listed, but only abnormal results are displayed) Labs Reviewed  COMPREHENSIVE METABOLIC PANEL - Abnormal; Notable for the following components:      Result Value   Glucose, Bld 111 (*)    Total Protein 8.2 (*)    AST 11 (*)    All other components within normal limits  CBC - Abnormal; Notable for the following components:   Platelets 435 (*)    All other components within normal limits  URINALYSIS, ROUTINE W REFLEX MICROSCOPIC - Abnormal; Notable for the following components:   APPearance CLOUDY (*)    Hgb urine dipstick MODERATE (*)    Protein, ur 100 (*)    Leukocytes,Ua LARGE (*)    All other components within normal limits  URINE CULTURE  LIPASE, BLOOD  LACTIC ACID, PLASMA  LACTIC ACID, PLASMA  TROPONIN I (HIGH SENSITIVITY)  TROPONIN I (HIGH SENSITIVITY)    EKG EKG Interpretation  Date/Time:  Thursday February 26 2022 14:52:15 EDT Ventricular Rate:  74 PR Interval:  222 QRS Duration: 85 QT Interval:  425 QTC Calculation: 472 R Axis:   70 Text Interpretation: Sinus rhythm Prolonged PR interval Consider left ventricular hypertrophy No significant change was found Confirmed by Ezequiel Essex [phone]) on 02/26/2022 2:55:53 PM  Radiology CT ABDOMEN PELVIS W CONTRAST  Result Date: 02/26/2022 CLINICAL DATA:  Abdominal pain, acute, nonlocalized EXAM: CT ABDOMEN AND PELVIS WITH CONTRAST TECHNIQUE: Multidetector CT imaging of the abdomen and pelvis was performed using the standard protocol following bolus administration of intravenous contrast. RADIATION DOSE REDUCTION: This exam was performed according to the departmental dose-optimization program which includes automated exposure control, adjustment of the mA and/or kV according to patient size and/or use of iterative reconstruction technique. CONTRAST:  117m  OMNIPAQUE IOHEXOL 300 MG/ML  SOLN COMPARISON:  04/06/2021 FINDINGS: Lower chest: Bibasilar linear scarring/atelectasis. Chronic small pericardial effusion. Hepatobiliary: No focal liver abnormality is seen. No gallstones, gallbladder wall thickening, or biliary dilatation. Pancreas: Unremarkable. No pancreatic ductal dilatation or surrounding inflammatory changes. Spleen: Normal in size without focal abnormality. Adrenals/Urinary Tract: Adrenals are unremarkable. Right kidney is absent. Left kidney is unremarkable. Bladder is unremarkable. Stomach/Bowel: Stomach is within normal limits. Bowel is normal in caliber. Vascular/Lymphatic: Atherosclerosis.  No enlarged nodes. Reproductive: Status post hysterectomy. No adnexal masses. Other: Chronic presacral soft tissue thickening. No acute abnormality of the abdominal wall. Musculoskeletal: No acute osseous abnormality. IMPRESSION: No acute abnormality.  Chronic findings  detailed above. Electronically Signed   By: Macy Mis M.D.   On: 02/26/2022 14:48    Procedures Procedures    Medications Ordered in ED Medications  sodium chloride 0.9 % bolus 1,000 mL (has no administration in time range)  alum & mag hydroxide-simeth (MAALOX/MYLANTA) 200-200-20 MG/5ML suspension 30 mL (has no administration in time range)    And  lidocaine (XYLOCAINE) 2 % viscous mouth solution 15 mL (has no administration in time range)  ondansetron (ZOFRAN) injection 4 mg (has no administration in time range)    ED Course/ Medical Decision Making/ A&P                           Medical Decision Making Amount and/or Complexity of Data Reviewed Labs: ordered. Decision-making details documented in ED Course. Radiology: ordered and independent interpretation performed. Decision-making details documented in ED Course. ECG/medicine tests: ordered and independent interpretation performed. Decision-making details documented in ED Course.  Risk OTC drugs. Prescription drug  management.  Acute on chronic upper abdominal pain with nausea no vomiting.  Reports pain ever since she had surgery in 2003.  Work-up is reassuring.  LFTs and lipase are normal.  No white blood cell count.  EKG is sinus rhythm.  Low suspicion for ACS.  Troponin negative. Pain somewhat improved after GI cocktail.  CT scan shows no acute pathology.  No evidence of bowel obstruction or other acute surgical pathology.  Patient feels improved.  Abdomen soft and nontender on recheck.  Will initiate PPI.  Avoid alcohol, caffeine, NSAID medications, spicy foods.  Follow-up with gastroenterology for likely endoscopy.  She is able to tolerate p.o.  Urine does have some blood and will send culture.  She denies any UTI symptoms  Return to the ED with worsening pain, vomiting, unable either to eat or drink or any other concerns.        Final Clinical Impression(s) / ED Diagnoses Final diagnoses:  Epigastric pain    Rx / DC Orders ED Discharge Orders     None         Cyriah Childrey, Annie Main, MD 02/26/22 1514

## 2022-02-26 NOTE — ED Provider Triage Note (Signed)
Emergency Medicine Provider Triage Evaluation Note  Janice Deleon , a 66 y.o. female  was evaluated in triage.  Pt complains of abdominal pain of about 6 months duration that recently worsened.  Endorses decreased p.o. intake because of this.  Denies fever dysuria, or vomiting.  History of solitary kidney.  Review of Systems  Positive: As above Negative: As above  Physical Exam  BP (!) 172/95 (BP Location: Right Arm)   Pulse 90   Temp 98 F (36.7 C) (Oral)   Resp 18   Ht '5\' 3"'$  (1.6 m)   Wt 54 kg   LMP 08/31/1998   SpO2 99%   BMI 21.08 kg/m  Gen:   Awake, no distress   Resp:  Normal effort  MSK:   Moves extremities without difficulty  Other:  Dentalized abdominal tenderness present  Medical Decision Making  Medically screening exam initiated at 12:12 PM.  Appropriate orders placed.  Janice Deleon was informed that the remainder of the evaluation will be completed by another provider, this initial triage assessment does not replace that evaluation, and the importance of remaining in the ED until their evaluation is complete.     Evlyn Courier, PA-C 02/26/22 1213

## 2022-02-26 NOTE — Discharge Instructions (Addendum)
Your testing is reassuring.  Your CT scan shows no acute pathology.  As we discussed take the stomach medication as prescribed and follow-up with a gastroenterologist.  Avoid alcohol, caffeine, NSAID medications, spicy foods.  Return to the ED with new or worsening symptoms

## 2022-02-26 NOTE — ED Triage Notes (Signed)
Patient c/o upper and mid abdominal pain. Patient states she has pain after eating x 6 months. Patient states the pain is worsening and is loosing weight.  Patient also c/o nausea, but no vomiting/diarrhea.

## 2022-02-27 ENCOUNTER — Ambulatory Visit: Payer: Medicare Other

## 2022-02-27 ENCOUNTER — Encounter: Payer: Self-pay | Admitting: Internal Medicine

## 2022-02-27 LAB — URINE CULTURE

## 2022-03-16 ENCOUNTER — Ambulatory Visit
Admission: RE | Admit: 2022-03-16 | Discharge: 2022-03-16 | Disposition: A | Discharge status: Home / Self Care | Payer: Medicare Other | Source: Ambulatory Visit | Attending: Student | Admitting: Student

## 2022-03-16 DIAGNOSIS — Z1231 Encounter for screening mammogram for malignant neoplasm of breast: Secondary | ICD-10-CM

## 2022-03-20 ENCOUNTER — Other Ambulatory Visit: Payer: Medicare Other

## 2022-04-09 ENCOUNTER — Ambulatory Visit: Payer: Medicare Other | Admitting: Internal Medicine

## 2022-04-09 ENCOUNTER — Telehealth: Payer: Self-pay | Admitting: Internal Medicine

## 2022-04-09 NOTE — Telephone Encounter (Signed)
Good Morning Dr. Henrene Pastor,  Patient called stating she needed to cancel her appointment with you today at 11:00 due to transportation issues.   Patient was rescheduled for 9/11 at 10:40

## 2022-05-11 ENCOUNTER — Ambulatory Visit (INDEPENDENT_AMBULATORY_CARE_PROVIDER_SITE_OTHER): Payer: Medicare Other | Admitting: Internal Medicine

## 2022-05-11 ENCOUNTER — Encounter: Payer: Self-pay | Admitting: *Deleted

## 2022-05-11 VITALS — BP 142/86 | HR 65 | Ht 63.0 in | Wt 117.0 lb

## 2022-05-11 DIAGNOSIS — R1013 Epigastric pain: Secondary | ICD-10-CM

## 2022-05-11 DIAGNOSIS — R634 Abnormal weight loss: Secondary | ICD-10-CM | POA: Diagnosis not present

## 2022-05-11 DIAGNOSIS — K219 Gastro-esophageal reflux disease without esophagitis: Secondary | ICD-10-CM | POA: Diagnosis not present

## 2022-05-11 DIAGNOSIS — A048 Other specified bacterial intestinal infections: Secondary | ICD-10-CM

## 2022-05-11 NOTE — Progress Notes (Signed)
HISTORY OF PRESENT ILLNESS:  Janice Deleon is a 66 y.o. 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  Patient sent today by her primary care physician regarding abdominal pain, weight loss, and positive H. pylori testing via breath test.  Patient reports chronic stable right-sided abdominal pain related to adhesions.  However, in recent months he began to develop postprandial abdominal discomfort and weight loss.  The discomfort led to food avoidance.  She lost 10 pounds.  She was evaluated in the emergency room for this pain February 26, 2022.  Blood work was obtained.  Comprehensive metabolic panel was unremarkable including liver tests and lipase.  CBC was unremarkable with hemoglobin 12.8.  CT scan of the abdomen and pelvis with contrast revealed no acute abnormalities.  She was noted to have a chronic small pericardial effusion for which elective evaluation is underway.  Transthoracic echocardiogram was performed March 17, 2022 and revealed normal ejection fraction of 50 to 55%.  Small pericardial effusion noted.  Follow-up arranged...  She was placed on omeprazole 20 mg daily.  She states that her problems with pain improved thereafter.  She did have chronic issues with heartburn which have also improved.  She states that her ability to eat was better and that she did gain some weight back.  She tells me that her PCP tested her for Helicobacter pylori via breath test.  Thereafter she was treated with twice daily PPI and antibiotics.  She is sent for this evaluation.  She has had colon cancer screening in the form of Cologuard testing via her PCP.  REVIEW OF SYSTEMS:  All non-GI ROS negative unless otherwise stated in the HPI. Past Medical History:  Diagnosis Date   Anemia    Aortic atherosclerosis (HCC)     Aorto-iliac atherosclerosis (HCC)    Cancer ([place])    Chronic hepatitis C ([place])    Chronic kidney disease    GERD (gastroesophageal reflux disease)    H. pylori infection    H/O kidney removal    History of cervical cancer    History of cocaine abuse ([place])    Hyperlipidemia    Hypertension    Major depressive disorder    Vitamin D deficiency     Past Surgical History:  Procedure Laterality Date   ABDOMINAL HYSTERECTOMY     BOWEL RESECTION     cancer of the uterus     CARPAL TUNNEL RELEASE Right 07/04/2021   Procedure: CARPAL TUNNEL RELEASE;  Surgeon: Earlie Server, MD;  Location: [facility];  Service: Orthopedics;  Laterality: Right;   colonscopy      at the age of 46   right nephrectomy      SHOULDER ARTHROSCOPY WITH OPEN ROTATOR CUFF REPAIR AND DISTAL CLAVICLE ACROMINECTOMY Right 07/04/2021   Procedure: SHOULDER ARTHROSCOPY WITH DEBRIDEMENT SUBACROMIAL DECOMPRESSION;  Surgeon: Earlie Server, MD;  Location: [facility];  Service: Orthopedics;  Laterality: Right;    Social History Janice Deleon  reports that she has never smoked. She has never used smokeless tobacco. She reports current drug use. Drug: Marijuana. She reports that she does not drink alcohol.  family history includes Cancer in her father; Diabetes in her brother, mother, and sister; Heart disease in her mother; Hypertension in her brother, father, mother, and sister; Stroke in her maternal aunt.  Allergies  Allergen Reactions   Hydrocodone-Acetaminophen Itching  PHYSICAL EXAMINATION: Vital signs: BP (!) 142/86   Pulse 65   Ht _0  (1.6 m)   Wt 117 lb (53.1 kg)   LMP 08/31/1998   BMI 20.73 kg/m   Constitutional: Thin but generally well-appearing, no acute distress Psychiatric: alert and oriented x3, cooperative Eyes: extraocular movements intact, anicteric, conjunctiva pink Mouth: oral pharynx moist, no lesions Neck: supple no lymphadenopathy Cardiovascular: heart regular rate and rhythm, no  murmur Lungs: clear to auscultation bilaterally Abdomen: soft, nontender, nondistended, no obvious ascites, no peritoneal signs, normal bowel sounds, no organomegaly.  Multiple abdominal incisions well-healed Rectal: Omitted Extremities: no clubbing, cyanosis, or lower extremity edema bilaterally Skin: no lesions on visible extremities Neuro: No focal deficits.  Cranial nerve is intact  ASSESSMENT:  1.  Postprandial abdominal pain and weight loss improved with PPI.  Suspect peptic ulcer disease.  Rule out malignant ulcer. 2.  Positive H. pylori breath test.  Treated 3.  Chronic GERD.  Improved on PPI 4.  Colon cancer screening under the direction of PCP via Cologuard   PLAN:  1.  Continue omeprazole DAILY.  Prescription refilled.  Medication risks reviewed 2.  Schedule upper endoscopy with biopsies.The nature of the procedure, as well as the risks, benefits, and alternatives were carefully and thoroughly reviewed with the patient. Ample time for discussion and questions allowed. The patient understood, was satisfied, and agreed to proceed.  3.  Follow-up with your PCP guarding Cologuard results.  If positive, will need to be referred back for colonoscopy.  If negative, readdress screening strategy in 3 years A total time of 60 minutes was spent preparing to see the patient, reviewing a myriad of tests and x-rays, obtaining comprehensive history, performing medically appropriate physical examination, ordering medication and endoscopic procedure, counseling and educating the patient regarding the above listed issues, and documenting clinical information in the health record

## 2022-05-11 NOTE — Patient Instructions (Signed)
_______________________________________________________  If you are age 64 or older, your body mass index should be between 23-30. Your Body mass index is 20.73 kg/m. If this is out of the aforementioned range listed, please consider follow up with your Primary Care Provider.  If you are age 68 or younger, your body mass index should be between 19-25. Your Body mass index is 20.73 kg/m. If this is out of the aformentioned range listed, please consider follow up with your Primary Care Provider.   ________________________________________________________  The [facility] providers would like to encourage you to use [facility] to communicate with providers for non-urgent requests or questions.  Due to long hold times on the telephone, sending your provider a message by [facility] may be a faster and more efficient way to get a response.  Please allow 48 business hours for a response.  Please remember that this is for non-urgent requests.  _______________________________________________________  Janice Deleon have been scheduled for an endoscopy. Please follow written instructions given to you at your visit today. If you use inhalers (even only as needed), please bring them with you on the day of your procedure.

## 2022-05-12 NOTE — Progress Notes (Unsigned)
Cardiology Office Note:   Date:  05/13/2022  NAME:  Janice Deleon    MRN: [id number] DOB:  September 24, 1955   PCP:  Cipriano Mile, NP  Cardiologist:  None  Electrophysiologist:  None   Referring MD: Cipriano Mile, NP   Chief Complaint  Patient presents with   Pericardial Effusion   History of Present Illness:   Janice Deleon is a 66 y.o. female with a hx of HTN, cervical CA who is being seen today for the evaluation of pericardial effusion at the request of Cipriano Mile, NP.  She underwent an echocardiogram for hypertension at her primary care office.  This demonstrated a small pericardial effusion.  A follow-up echocardiogram was ordered in August which demonstrated a small to moderate pericardial effusion.  She has been unaware of any symptoms.  She reports no chest pain or trouble breathing.  Her EKG demonstrates sinus rhythm with no acute ischemic changes or evidence of infarction.  Her medical history is significant for hypertension.  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  She apparently had cervical cancer and underwent radiation therapy.  She also has had a history of small bowel obstruction status post resection.  She is followed by gastroenterology.  She reports no chest pain or trouble breathing.  Her cholesterol level is reviewed below and is at goal.  She does report a family history of heart disease in her mother as well as sister.  She reports they had heart surgery in [place] and passed away shortly after surgery.  She seems to have escaped these troubles and she has not had any heart issues.  She does not smoke.  She reports alcohol in moderation.  No drug use.  She presents with her daughter.  She also has a son.  She has several grandchildren.  She is not working.    TSH 1.19 HGB 11.9 Cr 0.90  Echo (Heartbeat 03/17/2022) EF 55% Small pericardial effusion   Echo (Heartbeat 04/14/2022) EF 60% Small to  moderate pericardial effusion   Problem List HTN HLD -T chol 210, HDL 82, LDL 107, TG 114 3. Bowel obstruction s/p resection  4. Cervical CA s/p hysterectomy   Past Medical History: Past Medical History:  Diagnosis Date   Anemia    Aortic atherosclerosis (HCC)    Aorto-iliac atherosclerosis (HCC)    Cancer (HCC)    Chronic hepatitis C ([place])    Chronic kidney disease    GERD (gastroesophageal reflux disease)    H. pylori infection    H/O kidney removal    History of cervical cancer    History of cocaine abuse ([place])    Hyperlipidemia    Hypertension    Major depressive disorder    Vitamin D deficiency     Past Surgical History: Past Surgical History:  Procedure Laterality Date   ABDOMINAL HYSTERECTOMY     BOWEL RESECTION     cancer of the uterus     CARPAL TUNNEL RELEASE Right 07/04/2021   Procedure: CARPAL TUNNEL RELEASE;  Surgeon: Earlie Server, MD;  Location: [facility];  Service: Orthopedics;  Laterality: Right;   colonscopy      at the age of 33   right nephrectomy      SHOULDER ARTHROSCOPY WITH OPEN ROTATOR CUFF REPAIR AND DISTAL CLAVICLE ACROMINECTOMY Right 07/04/2021   Procedure: SHOULDER ARTHROSCOPY WITH DEBRIDEMENT SUBACROMIAL DECOMPRESSION;  Surgeon: Earlie Server, MD;  Location: [facility];  Service: Orthopedics;  Laterality: Right;    Current Medications: Current Meds  Medication Sig   acetaminophen (TYLENOL) 500 MG tablet Take 1,000 mg by mouth every 6 (six) hours as needed for moderate pain.   amLODipine (NORVASC) 5 MG tablet Take 5 mg by mouth daily.   omeprazole (PRILOSEC) 20 MG capsule Take 1 capsule (20 mg total) by mouth daily.     Allergies:    Hydrocodone-acetaminophen   Social History: Social History   Socioeconomic History   Marital status: Single    Spouse name: Not on file   Number of children: 2   Years of education: Not on file   Highest education level: Not on file  Occupational History   Occupation: diabled   Occupation:  Retired Passenger transport manager care  Tobacco Use   Smoking status: Never   Smokeless tobacco: Never  Vaping Use   Vaping Use: Never used  Substance and Sexual Activity   Alcohol use: No   Drug use: Yes    Types: Marijuana   Sexual activity: Not on file  Other Topics Concern   Not on file  Social History Narrative   Not on file   Social Determinants of Health   Financial Resource Strain: Not on file  Food Insecurity: Not on file  Transportation Needs: Not on file  Physical Activity: Not on file  Stress: Not on file  Social Connections: Not on file     Family History: The patient'sfamily history includes Cancer in her father; Diabetes in her brother, mother, and sister; Heart disease in her mother and sister; Heart failure in her mother and sister; Hypertension in her brother, father, mother, and sister; Stroke in her maternal aunt. There is no history of Stomach cancer, Esophageal cancer, or Colon cancer.  ROS:   All other ROS reviewed and negative. Pertinent positives noted in the HPI.     EKGs/Labs/Other Studies Reviewed:   The following studies were personally reviewed by me today:  EKG:  EKG is ordered today.  The ekg ordered today demonstrates sinus bradycardia heart rate 58, no acute ischemic changes or evidence of infarction, and was personally reviewed by me.   Recent Labs: 02/26/2022: ALT 8; BUN 13; Creatinine, Ser 0.99; Hemoglobin 12.8; Platelets 435; Potassium 3.6; Sodium 138   Recent Lipid Panel No results found for: "CHOL", "TRIG", "HDL", "CHOLHDL", "VLDL", "LDLCALC", "LDLDIRECT"  Physical Exam:   VS:  BP 120/70   Pulse (!) 58   Ht '5\' 3"'$  (1.6 m)   Wt 119 lb 6.4 oz (54.2 kg)   LMP 08/31/1998   SpO2 92%   BMI 21.15 kg/m    Wt Readings from Last 3 Encounters:  05/13/22 119 lb 6.4 oz (54.2 kg)  05/11/22 117 lb (53.1 kg)  02/26/22 119 lb (54 kg)    General: Well nourished, well developed, in no acute distress Head: Atraumatic, normal size  Eyes: PEERLA, EOMI  Neck:  Supple, no JVD Endocrine: No thryomegaly Cardiac: Normal S1, S2; RRR; no murmurs, rubs, or gallops Lungs: Clear to auscultation bilaterally, no wheezing, rhonchi or rales  Abd: Soft, nontender, no hepatomegaly  Ext: No edema, pulses 2+ Musculoskeletal: No deformities, BUE and BLE strength normal and equal Skin: Warm and dry, no rashes   Neuro: Alert and oriented to person, place, time, and situation, CNII-XII grossly intact, no focal deficits  Psych: Normal mood and affect   ASSESSMENT:   Infantof Villagomez is a 66 y.o. female who presents for the following: 1. Pericardial effusion  PLAN:   1. Pericardial effusion -Small to moderate pericardial effusion on echocardiogram through primary care physician office.  I can unfortunately not review the images.  Unclear why she would have this.  She has normal thyroid studies.  She has no stigmata of autoimmune disease.  It is really tough for me to say if this is something to worry about.  She has no symptoms of tamponade.  I suspect this is insignificant.  We will obtain an echocardiogram in our office and likely just follow this.  I see no need for aggressive work-up given her lack of symptoms.  Her blood pressure is well controlled.  All of her CV risk factors are well controlled.  She will see me back as needed based on the results of the ultrasound.  Disposition: Return if symptoms worsen or fail to improve.  Medication Adjustments/Labs and Tests Ordered: Current medicines are reviewed at length with the patient today.  Concerns regarding medicines are outlined above.  Orders Placed This Encounter  Procedures   EKG 12-Lead   ECHOCARDIOGRAM COMPLETE   No orders of the defined types were placed in this encounter.   Patient Instructions  Medication Instructions:  The current medical regimen is effective;  continue present plan and medications.  *If you need a refill on your cardiac medications before your next appointment, please  call your pharmacy*   Testing/Procedures: Echocardiogram - Your physician has requested that you have an echocardiogram. Echocardiography is a painless test that uses sound waves to create images of your heart. It provides your doctor with information about the size and shape of your heart and how well your heart's chambers and valves are working. This procedure takes approximately one hour. There are no restrictions for this procedure.     Follow-Up: At [facility], you and your health needs are our priority.  As part of our continuing mission to provide you with exceptional heart care, we have created designated Provider Care Teams.  These Care Teams include your primary Cardiologist (physician) and Advanced Practice Providers (APPs -  Physician Assistants and Nurse Practitioners) who all work together to provide you with the care you need, when you need it.  We recommend signing up for the patient portal called "MyChart".  Sign up information is provided on this After Visit Summary.  MyChart is used to connect with patients for Virtual Visits (Telemedicine).  Patients are able to view lab/test results, encounter notes, upcoming appointments, etc.  Non-urgent messages can be sent to your provider as well.   To learn more about what you can do with MyChart, go to [email or web address].    Your next appointment:   As needed  The format for your next appointment:   In Person  Provider:   Eleonore Chiquito, MD          Signed, Addison Naegeli. Audie Box, MD, [place]  [address], [place], [place] [address] [phone]  05/13/2022 11:40 AM

## 2022-05-13 ENCOUNTER — Ambulatory Visit: Payer: Medicare Other | Attending: Cardiovascular Disease | Admitting: Cardiovascular Disease

## 2022-05-13 ENCOUNTER — Encounter: Payer: Self-pay | Admitting: Cardiovascular Disease

## 2022-05-13 VITALS — BP 120/70 | HR 58 | Ht 63.0 in | Wt 119.4 lb

## 2022-05-13 DIAGNOSIS — I3139 Other pericardial effusion (noninflammatory): Secondary | ICD-10-CM

## 2022-05-13 NOTE — Patient Instructions (Signed)
Medication Instructions:  The current medical regimen is effective;  continue present plan and medications.  *If you need a refill on your cardiac medications before your next appointment, please call your pharmacy*   Testing/Procedures: Echocardiogram - Your physician has requested that you have an echocardiogram. Echocardiography is a painless test that uses sound waves to create images of your heart. It provides your doctor with information about the size and shape of your heart and how well your heart's chambers and valves are working. This procedure takes approximately one hour. There are no restrictions for this procedure.     Follow-Up: At [facility], you and your health needs are our priority.  As part of our continuing mission to provide you with exceptional heart care, we have created designated Provider Care Teams.  These Care Teams include your primary Cardiologist (physician) and Advanced Practice Providers (APPs -  Physician Assistants and Nurse Practitioners) who all work together to provide you with the care you need, when you need it.  We recommend signing up for the patient portal called "MyChart".  Sign up information is provided on this After Visit Summary.  MyChart is used to connect with patients for Virtual Visits (Telemedicine).  Patients are able to view lab/test results, encounter notes, upcoming appointments, etc.  Non-urgent messages can be sent to your provider as well.   To learn more about what you can do with MyChart, go to [email or web address].    Your next appointment:   As needed  The format for your next appointment:   In Person  Provider:   Eleonore Chiquito, MD

## 2022-05-20 IMAGING — CT CT SHOULDER*R* W/O CM
3 series · 15 of 35 positions shown, 18 images · non-contrast
Comparison: None.

CLINICAL DATA: Right shoulder pain for 3 months.

EXAM:
CT OF THE UPPER RIGHT EXTREMITY WITHOUT CONTRAST
TECHNIQUE: Multidetector CT imaging of the upper right extremity was performed
according to the standard protocol.

[Series 4: shoulder 2.00 br40 s3 axial soft · axial · 0.43mm/px · z∈[-797,-627]mm · 7 of 101 slices shown, 9 images]
[im 8/101  soft-tissue]
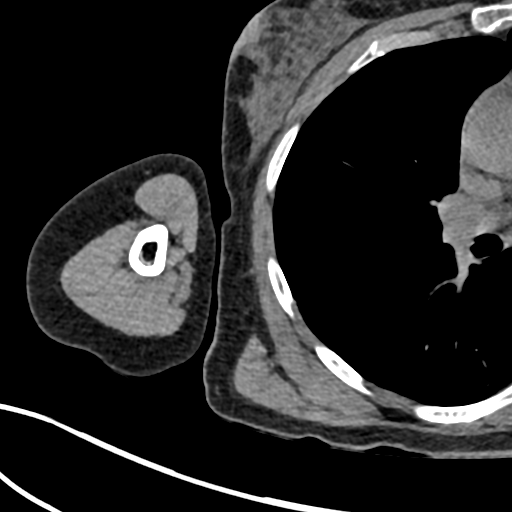
[im 8/101  bone]
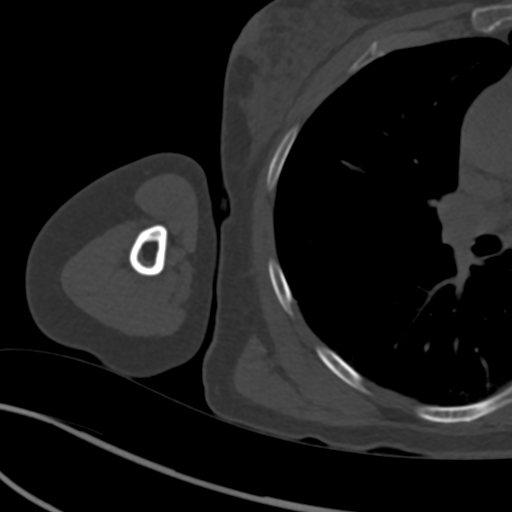
[im 24/101  bone]
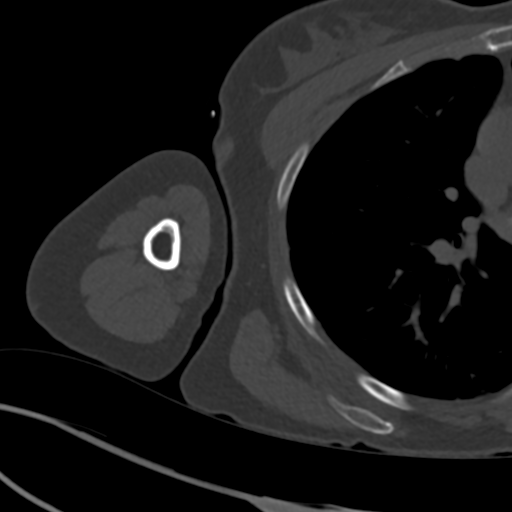
[im 39/101  bone]
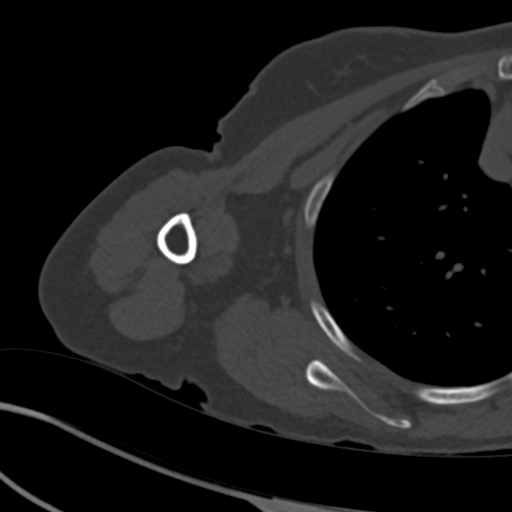
[im 54/101  bone]
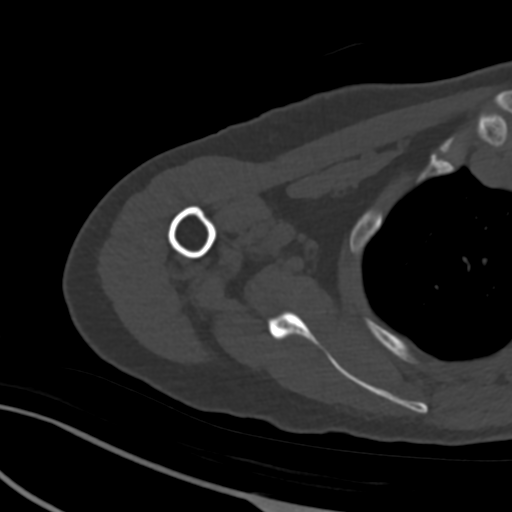
[im 62/101  soft-tissue]
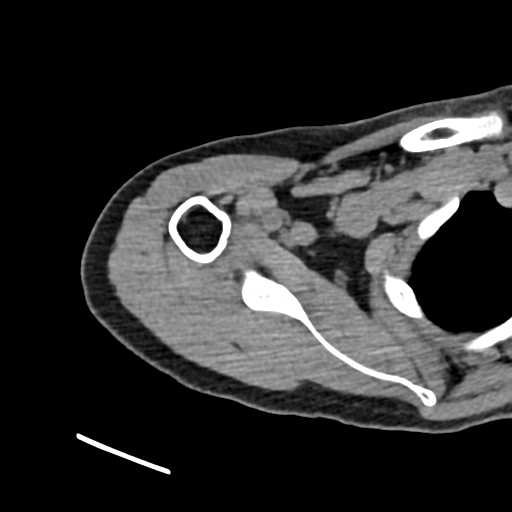
[im 62/101  bone]
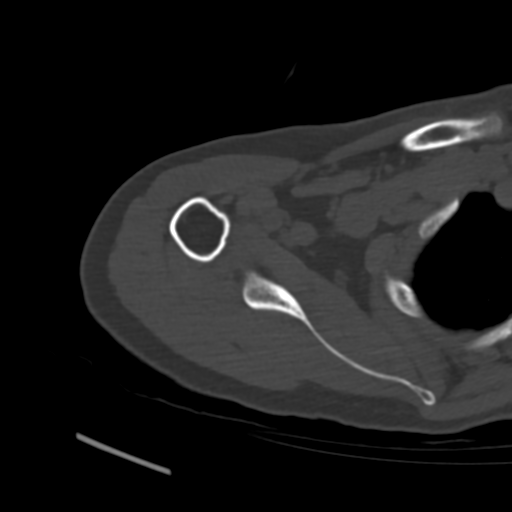
[im 77/101  bone]
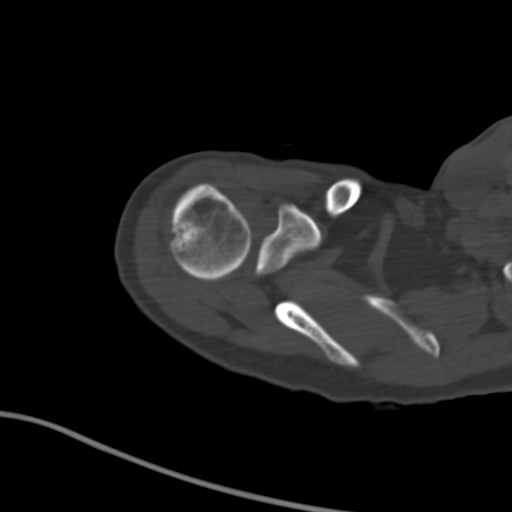
[im 93/101  bone]
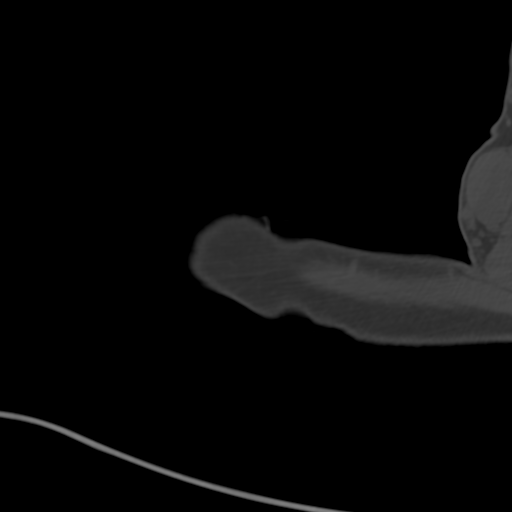

[Series 8: shoulder 2.00 br40 s3 cor soft · coronal · 0.40mm/px · 3 of 80 slices shown]
[im 16/80  bone]
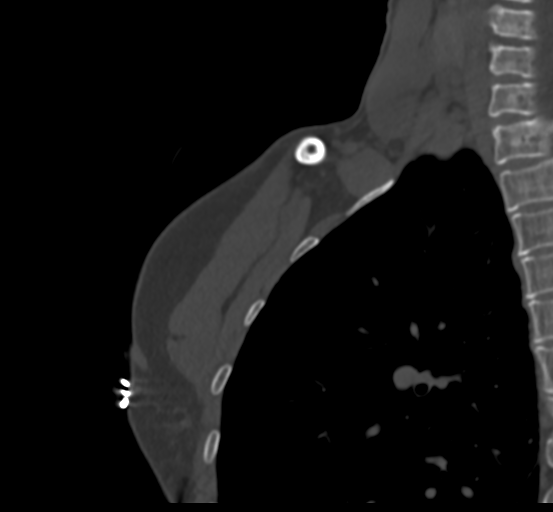
[im 32/80  bone]
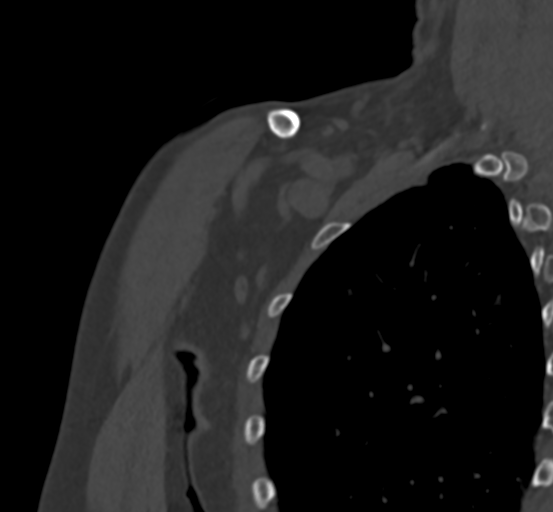
[im 48/80  bone]
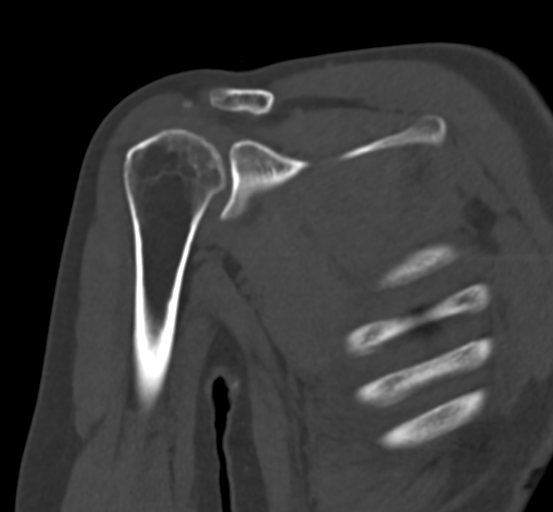

[Series 12: shoulder 2.00 br40 s3 sag soft · sagittal · 0.36mm/px · 5 of 109 slices shown, 6 images]
[im 37/109  bone]
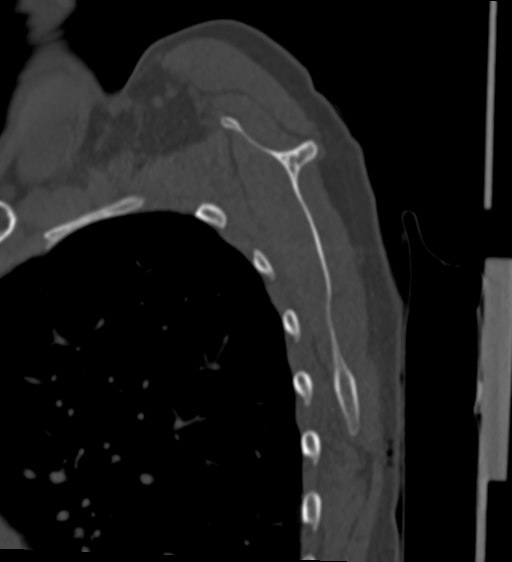
[im 46/109  bone]
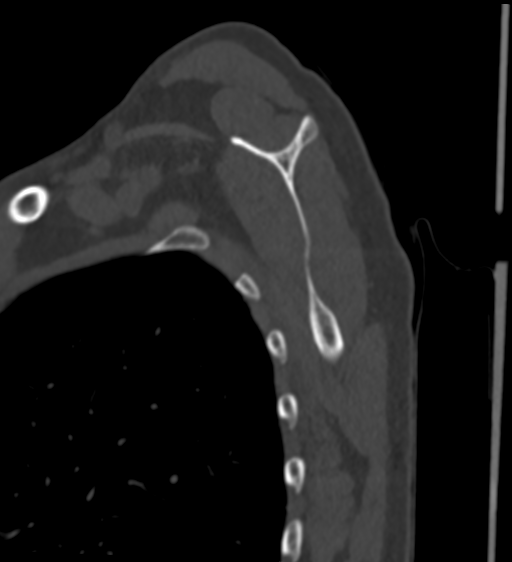
[im 55/109  soft-tissue]
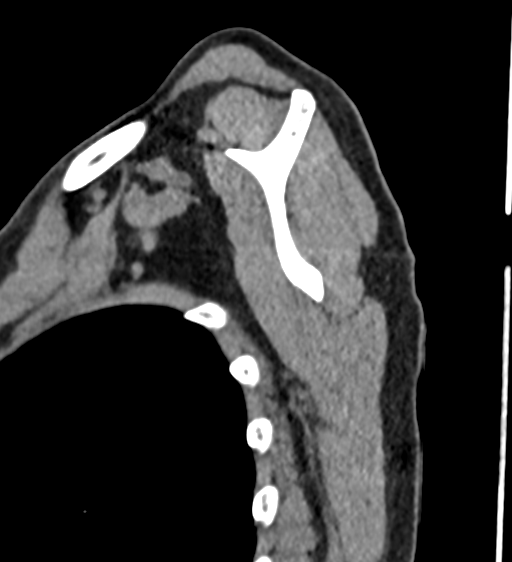
[im 55/109  bone]
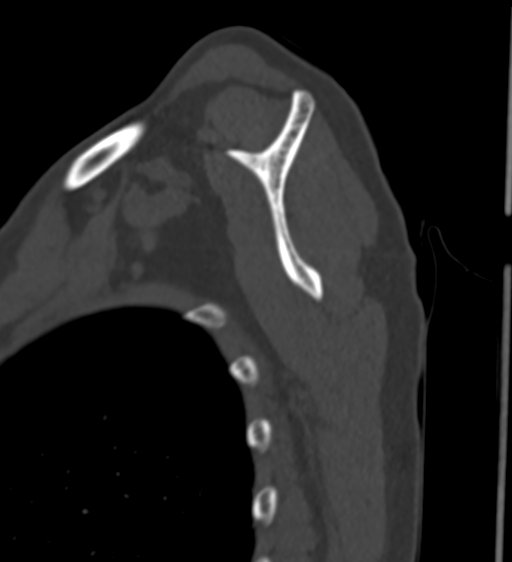
[im 64/109  bone]
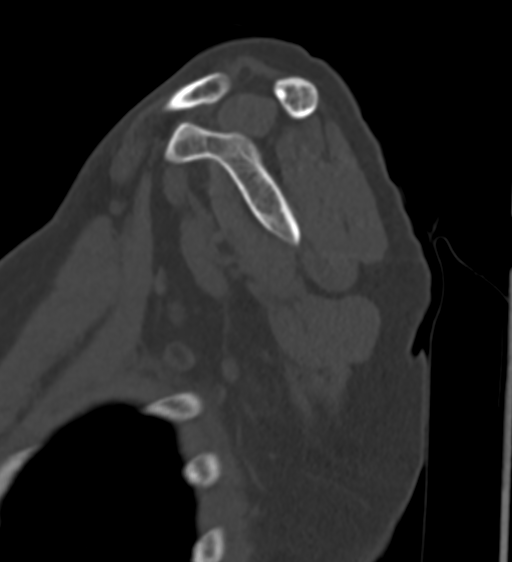
[im 73/109  bone]
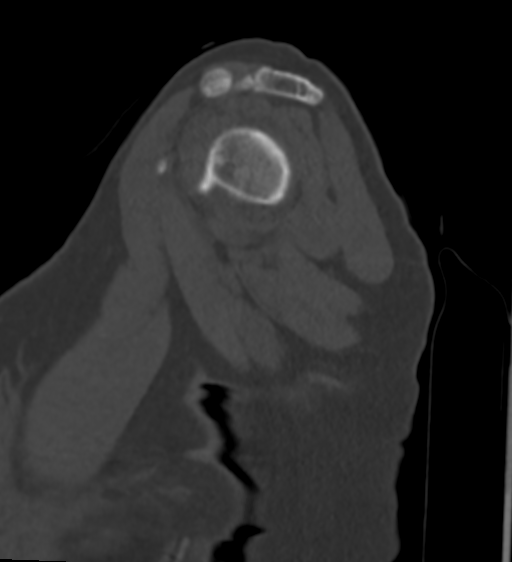

[15 of 35 positions shown; findings below may reference images not displayed]

FINDINGS: The glenohumeral joint is maintained. No significant degenerative
changes. The AC joint is intact. Os acromial noted. The acromion is
type 2 in shape. No lateral downsloping or subacromial spurring.
Mild subchondral cystic type changes noted at the greater tuberosity
near the rotator cuff attachment.

Grossly by CT scan the rotator cuff tendons are intact. No findings
suspicious for a full-thickness retracted rotator cuff tear. The
rotator cuff muscles appear normal. No evidence of fatty atrophy.

The visualized right ribs are intact and the visualized right lung
is clear.
IMPRESSION: 1. Grossly by CT scan the rotator cuff tendons are intact. No
findings suspicious for a full-thickness retracted rotator cuff
tear.
2. No acute bony findings or significant degenerative changes.
3. Mild subchondral cystic type changes at the greater tuberosity
near the rotator cuff attachment.
4. Os acromial noted.

## 2022-05-20 IMAGING — CT CT CERVICAL SPINE W/O CM
4 of 5 series · 14 of 33 positions shown, 16 images · non-contrast
Comparison: None.

CLINICAL DATA: Neck and right shoulder and arm pain for 3 months.

EXAM:
CT CERVICAL SPINE WITHOUT CONTRAST
TECHNIQUE: Multidetector CT imaging of the cervical spine was performed without
intravenous contrast. Multiplanar CT image reconstructions were also
generated.

[Series 4: c-spine 2.00 br40 s3 axial (person_name) · axial · 0.27mm/px · z∈[-682,-596]mm · 3 of 87 slices shown]
[im 22/87  bone]
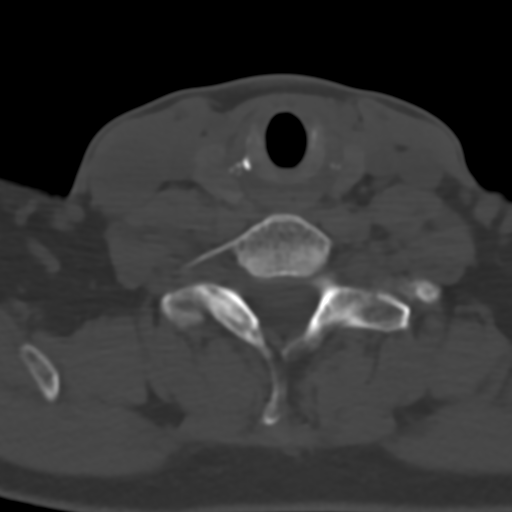
[im 44/87  bone]
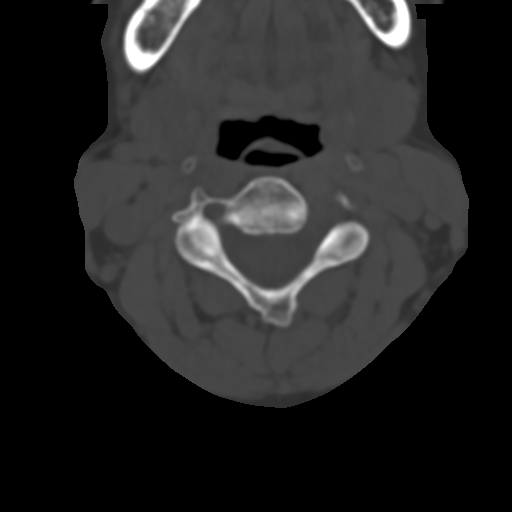
[im 65/87  bone]
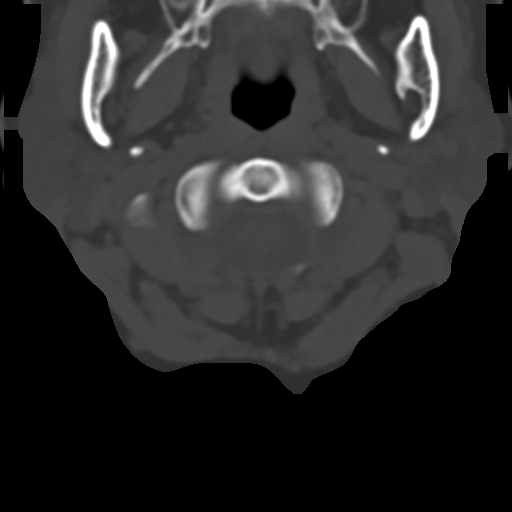

[Series 5: c-spine 2.00 br60 s3 sag bone · sagittal · 0.27mm/px · 5 of 69 slices shown, 6 images]
[im 23/69  bone]
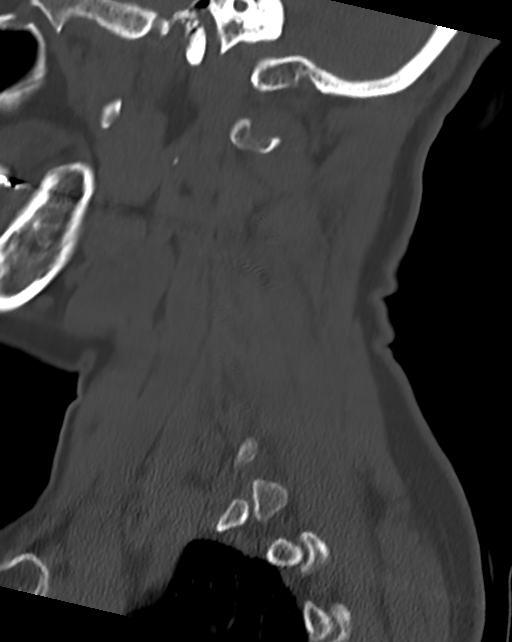
[im 29/69  bone]
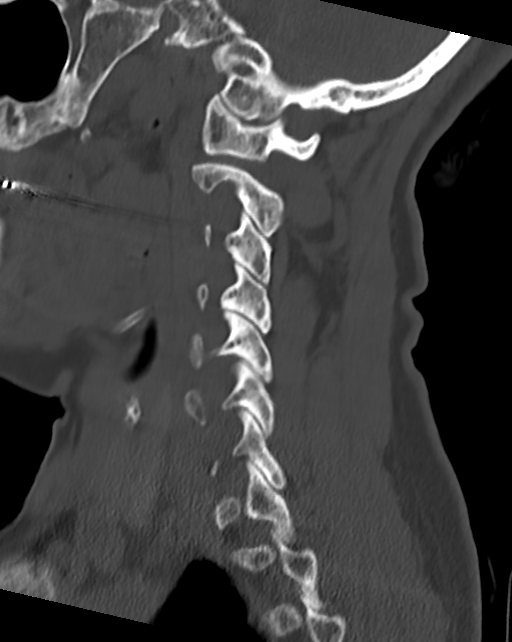
[im 35/69  soft-tissue]
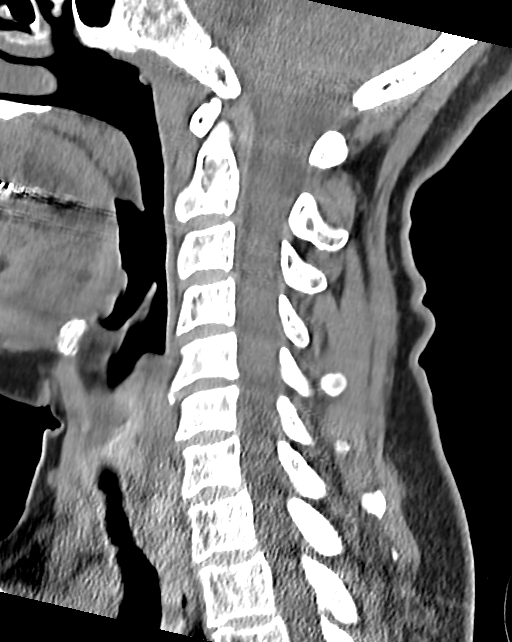
[im 35/69  bone]
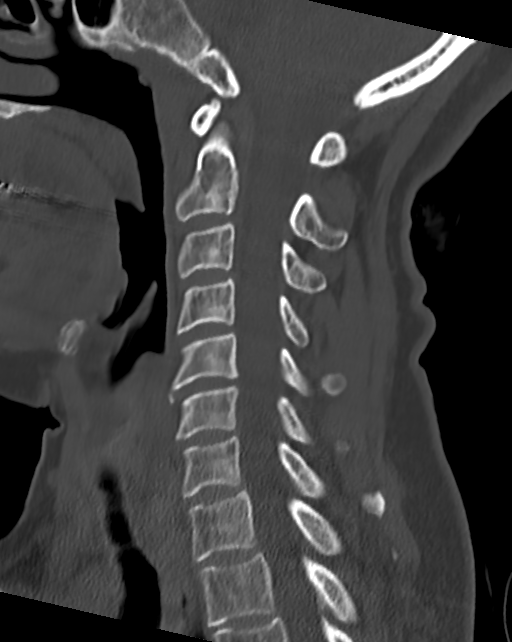
[im 40/69  bone]
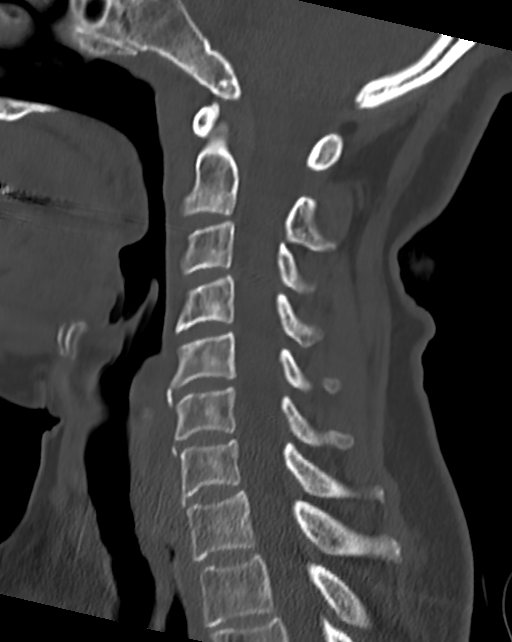
[im 46/69  bone]
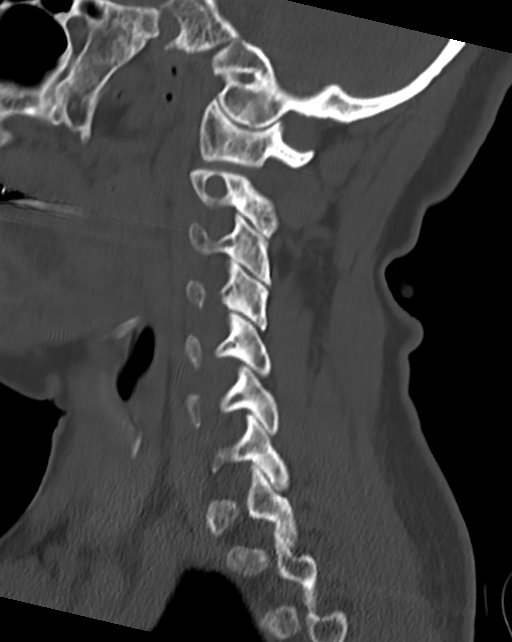

[Series 7: c-spine 2.00 hr60 s3 cor bone · coronal · 0.27mm/px · 3 of 69 slices shown]
[im 14/69  bone]
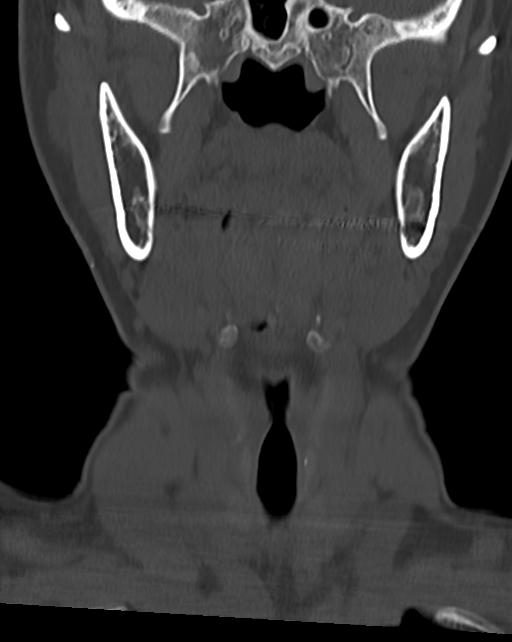
[im 28/69  bone]
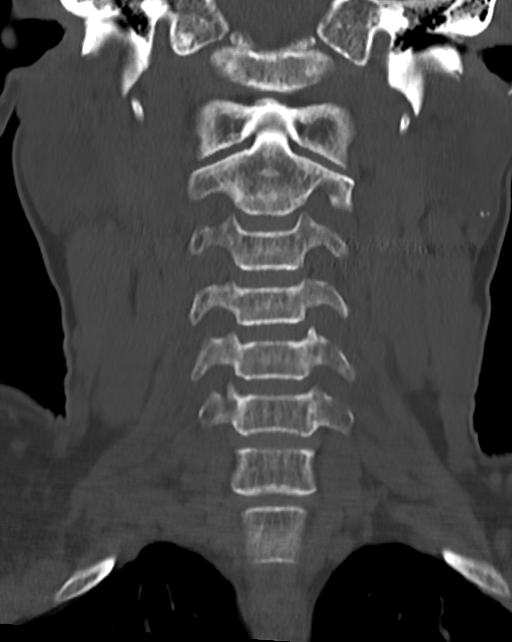
[im 41/69  bone]
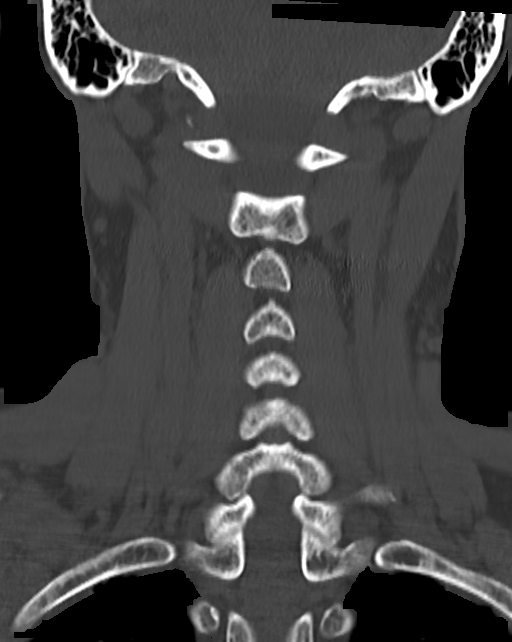

[Series 10: c-spine 2.00 hr60 s3 orthogonal axial · axial · 0.27mm/px · z∈[-706,-622]mm · 3 of 84 slices shown, 4 images]
[im 21/84  soft-tissue]
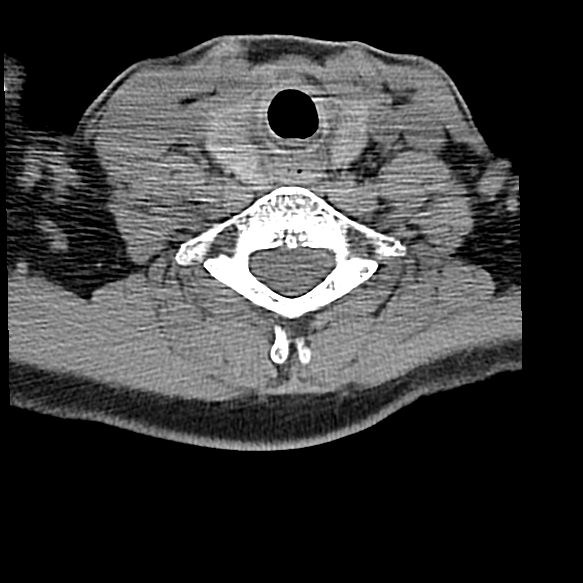
[im 21/84  bone]
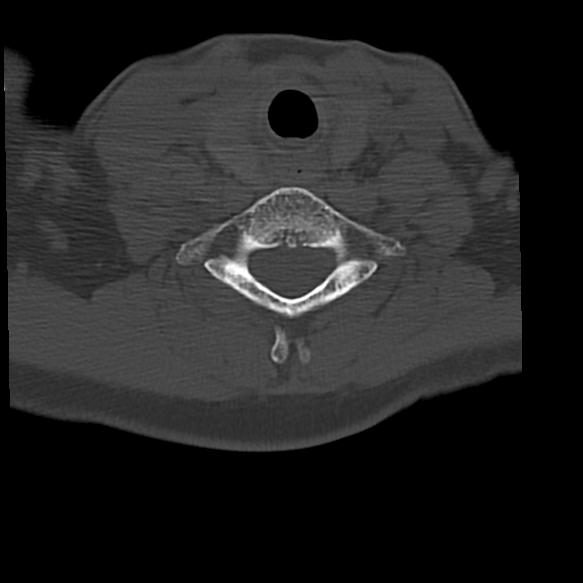
[im 42/84  bone]
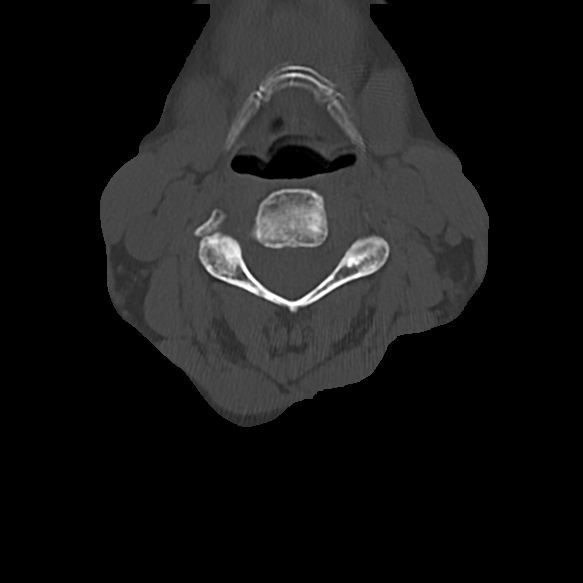
[im 63/84  bone]
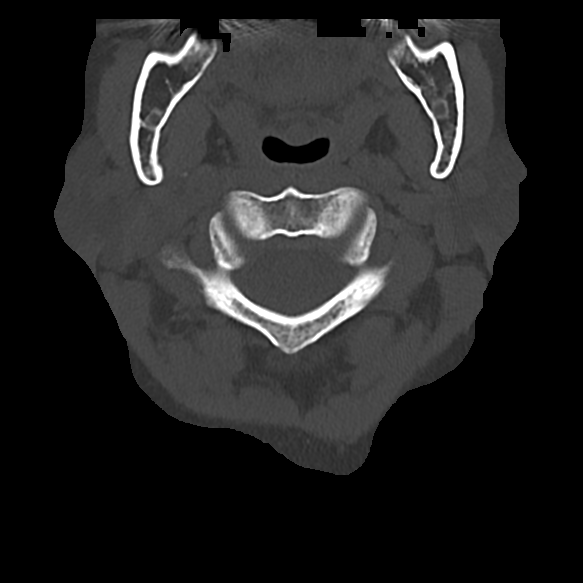

[14 of 33 positions shown; findings below may reference images not displayed]

FINDINGS: Alignment: Normal

Skull base and vertebrae: No acute fracture. No primary bone lesion
or focal pathologic process.

Soft tissues and spinal canal: No prevertebral fluid or swelling. No
visible canal hematoma.

Disc levels: No large disc protrusions, spinal or foraminal
stenosis. The spinal canal is quite generous. The facets are
normally aligned. No significant facet disease.

Upper chest: The lung apices are grossly clear.

Other: Mild right thyroid goiter with small nodules noted
bilaterally. The largest nodule measures 8 mm in the right lobe. Not
clinically significant; no follow-up imaging recommended (ref: [HOSPITAL]. [DATE]): 143-50).
IMPRESSION: 1. Normal alignment and no acute bony findings.
2. No large disc protrusions, spinal or foraminal stenosis.

## 2022-05-26 ENCOUNTER — Ambulatory Visit (HOSPITAL_COMMUNITY): Payer: Medicare Other | Attending: Cardiovascular Disease

## 2022-05-26 DIAGNOSIS — I3139 Other pericardial effusion (noninflammatory): Secondary | ICD-10-CM | POA: Diagnosis not present

## 2022-05-26 LAB — ECHOCARDIOGRAM COMPLETE
Area-P 1/2: 3.3 cm2
S' Lateral: 2.6 cm

## 2022-06-05 ENCOUNTER — Other Ambulatory Visit: Payer: Self-pay

## 2022-06-05 DIAGNOSIS — I3139 Other pericardial effusion (noninflammatory): Secondary | ICD-10-CM

## 2022-06-08 ENCOUNTER — Ambulatory Visit (AMBULATORY_SURGERY_CENTER): Payer: Medicare Other | Admitting: Internal Medicine

## 2022-06-08 ENCOUNTER — Encounter: Payer: Self-pay | Admitting: Internal Medicine

## 2022-06-08 VITALS — BP 186/100 | HR 71 | Temp 97.7°F | Resp 16 | Ht 63.0 in | Wt 117.0 lb

## 2022-06-08 DIAGNOSIS — K250 Acute gastric ulcer with hemorrhage: Secondary | ICD-10-CM

## 2022-06-08 DIAGNOSIS — R1013 Epigastric pain: Secondary | ICD-10-CM

## 2022-06-08 DIAGNOSIS — K219 Gastro-esophageal reflux disease without esophagitis: Secondary | ICD-10-CM | POA: Diagnosis not present

## 2022-06-08 DIAGNOSIS — K2950 Unspecified chronic gastritis without bleeding: Secondary | ICD-10-CM | POA: Diagnosis not present

## 2022-06-08 DIAGNOSIS — B9681 Helicobacter pylori [H. pylori] as the cause of diseases classified elsewhere: Secondary | ICD-10-CM | POA: Diagnosis not present

## 2022-06-08 DIAGNOSIS — A048 Other specified bacterial intestinal infections: Secondary | ICD-10-CM

## 2022-06-08 MED ORDER — SODIUM CHLORIDE 0.9 % IV SOLN: 500.0000 mL | Freq: Once | INTRAVENOUS | Status: DC

## 2022-06-08 NOTE — Progress Notes (Signed)
Called to room to assist during endoscopic procedure.  Patient ID and intended procedure confirmed with present staff. Received instructions for my participation in the procedure from the performing physician.  

## 2022-06-08 NOTE — Progress Notes (Signed)
Report to PACU, RN, vss, BBS= Clear.  

## 2022-06-08 NOTE — Progress Notes (Signed)
HISTORY OF PRESENT ILLNESS:   Janice Deleon is a 66 y.o. 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  Patient sent today by her primary care physician regarding abdominal pain, weight loss, and positive H. pylori testing via breath test.   Patient reports chronic stable right-sided abdominal pain related to adhesions.  However, in recent months he began to develop postprandial abdominal discomfort and weight loss.  The discomfort led to food avoidance.  She lost 10 pounds.  She was evaluated in the emergency room for this pain February 26, 2022.  Blood work was obtained.  Comprehensive metabolic panel was unremarkable including liver tests and lipase.  CBC was unremarkable with hemoglobin 12.8.  CT scan of the abdomen and pelvis with contrast revealed no acute abnormalities.  She was noted to have a chronic small pericardial effusion for which elective evaluation is underway.  Transthoracic echocardiogram was performed March 17, 2022 and revealed normal ejection fraction of 50 to 55%.  Small pericardial effusion noted.  Follow-up arranged...  She was placed on omeprazole 20 mg daily.  She states that her problems with pain improved thereafter.  She did have chronic issues with heartburn which have also improved.  She states that her ability to eat was better and that she did gain some weight back.  She tells me that her PCP tested her for Helicobacter pylori via breath test.  Thereafter she was treated with twice daily PPI and antibiotics.  She is sent for this evaluation.  She has had colon cancer screening in the form of Cologuard testing via her PCP.   REVIEW OF SYSTEMS:   All non-GI ROS negative unless otherwise stated in the HPI.     Past Medical History:  Diagnosis Date   Anemia     Aortic atherosclerosis  (HCC)     Aorto-iliac atherosclerosis (HCC)     Cancer ([place])     Chronic hepatitis C ([place])     Chronic kidney disease     GERD (gastroesophageal reflux disease)     H. pylori infection     H/O kidney removal     History of cervical cancer     History of cocaine abuse ([place])     Hyperlipidemia     Hypertension     Major depressive disorder     Vitamin D deficiency             Past Surgical History:  Procedure Laterality Date   ABDOMINAL HYSTERECTOMY       BOWEL RESECTION       cancer of the uterus       CARPAL TUNNEL RELEASE Right 07/04/2021    Procedure: CARPAL TUNNEL RELEASE;  Surgeon: Earlie Server, MD;  Location: [facility];  Service: Orthopedics;  Laterality: Right;   colonscopy         at the age of 17   right nephrectomy        SHOULDER ARTHROSCOPY WITH OPEN ROTATOR CUFF REPAIR AND DISTAL CLAVICLE ACROMINECTOMY Right 07/04/2021    Procedure: SHOULDER ARTHROSCOPY WITH DEBRIDEMENT SUBACROMIAL DECOMPRESSION;  Surgeon: Earlie Server, MD;  Location: [facility];  Service: Orthopedics;  Laterality: Right;      Social History Janice Deleon  reports that she has never smoked. She has never used smokeless tobacco. She reports current drug use. Drug: Marijuana. She reports that she does not drink alcohol.  family history includes Cancer in her father; Diabetes in her brother, mother, and sister; Heart disease in her mother; Hypertension in her brother, father, mother, and sister; Stroke in her maternal aunt.       Allergies  Allergen Reactions   Hydrocodone-Acetaminophen Itching          PHYSICAL EXAMINATION: Vital signs: BP (!) 142/86   Pulse 65   Ht '5\' 3"'$  (1.6 m)   Wt 117 lb (53.1 kg)   LMP 08/31/1998   BMI 20.73 kg/m   Constitutional: Thin but generally well-appearing, no acute distress Psychiatric: alert and oriented x3, cooperative Eyes: extraocular movements intact, anicteric, conjunctiva pink Mouth: oral pharynx moist, no lesions Neck: supple no  lymphadenopathy Cardiovascular: heart regular rate and rhythm, no murmur Lungs: clear to auscultation bilaterally Abdomen: soft, nontender, nondistended, no obvious ascites, no peritoneal signs, normal bowel sounds, no organomegaly.  Multiple abdominal incisions well-healed Rectal: Omitted Extremities: no clubbing, cyanosis, or lower extremity edema bilaterally Skin: no lesions on visible extremities Neuro: No focal deficits.  Cranial nerve is intact   ASSESSMENT:   1.  Postprandial abdominal pain and weight loss improved with PPI.  Suspect peptic ulcer disease.  Rule out malignant ulcer. 2.  Positive H. pylori breath test.  Treated 3.  Chronic GERD.  Improved on PPI 4.  Colon cancer screening under the direction of PCP via Cologuard     PLAN:   1.  Continue omeprazole DAILY.  Prescription refilled.  Medication risks reviewed 2.  Schedule upper endoscopy with biopsies.The nature of the procedure, as well as the risks, benefits, and alternatives were carefully and thoroughly reviewed with the patient. Ample time for discussion and questions allowed. The patient understood, was satisfied, and agreed to proceed.  3.  Follow-up with your PCP guarding Cologuard results.  If positive, will need to be referred back for colonoscopy.  If negative, readdress screening strategy in 3 years.

## 2022-06-08 NOTE — Patient Instructions (Addendum)
-   Patient has a contact number available for emergencies. The signs and symptoms of potential delayed complications were discussed with the patient. Return to normal activities tomorrow. Written discharge instructions were provided to the patient. - Resume previous diet. - Continue present medications. TAKE BLOOD PRESSURE MEDICATION WHEN YOU GET HOME  - Await pathology results.  YOU HAD AN ENDOSCOPIC PROCEDURE TODAY AT [facility]:   Refer to the procedure report that was given to you for any specific questions about what was found during the examination.  If the procedure report does not answer your questions, please call your gastroenterologist to clarify.  If you requested that your care partner not be given the details of your procedure findings, then the procedure report has been included in a sealed envelope for you to review at your convenience later.  YOU SHOULD EXPECT: Some feelings of bloating in the abdomen. Passage of more gas than usual.  Walking can help get rid of the air that was put into your GI tract during the procedure and reduce the bloating. If you had a lower endoscopy (such as a colonoscopy or flexible sigmoidoscopy) you may notice spotting of blood in your stool or on the toilet paper. If you underwent a bowel prep for your procedure, you may not have a normal bowel movement for a few days.  Please Note:  You might notice some irritation and congestion in your nose or some drainage.  This is from the oxygen used during your procedure.  There is no need for concern and it should clear up in a day or so.  SYMPTOMS TO REPORT IMMEDIATELY:  Following upper endoscopy (EGD)  Vomiting of blood or coffee ground material  New chest pain or pain under the shoulder blades  Painful or persistently difficult swallowing  New shortness of breath  Fever of 100F or higher  Black, tarry-looking stools  For urgent or emergent issues, a gastroenterologist can be reached at  any hour by calling [phone]. Do not use MyChart messaging for urgent concerns.    DIET:  We do recommend a small meal at first, but then you may proceed to your regular diet.  Drink plenty of fluids but you should avoid alcoholic beverages for 24 hours.  ACTIVITY:  You should plan to take it easy for the rest of today and you should NOT DRIVE or use heavy machinery until tomorrow (because of the sedation medicines used during the test).    FOLLOW UP: Our staff will call the number listed on your records the next business day following your procedure.  We will call around 7:15- 8:00 am to check on you and address any questions or concerns that you may have regarding the information given to you following your procedure. If we do not reach you, we will leave a message.     If any biopsies were taken you will be contacted by phone or by letter within the next 1-3 weeks.  Please call us at [phone] if you have not heard about the biopsies in 3 weeks.    SIGNATURES/CONFIDENTIALITY: You and/or your care partner have signed paperwork which will be entered into your electronic medical record.  These signatures attest to the fact that that the information above on your After Visit Summary has been reviewed and is understood.  Full responsibility of the confidentiality of this discharge information lies with you and/or your care-partner.

## 2022-06-08 NOTE — Progress Notes (Signed)
In recovery BP 195/101 and 186/100. CRNA made aware and stated pt could go home. Pt instructed to take her morning BP medication when she arrived home. Pt verbalized understanding and stated she was going straight home and would take them after she ate something.

## 2022-06-08 NOTE — Op Note (Signed)
[place] Patient Name: Janice Deleon Procedure Date: 06/08/2022 10:15 AM MRN: [id number] Endoscopist: Docia Chuck. Henrene Pastor , MD Age: 66 Referring MD:  Date of Birth: October 04, 1955 Gender: Female Account #: [id number] Procedure:                Upper GI endoscopy with biopsies Indications:              Epigastric abdominal pain, Esophageal reflux,                            positive H. pylori breath test status posttreatment Medicines:                Monitored Anesthesia Care Procedure:                Pre-Anesthesia Assessment:                           - Prior to the procedure, a History and Physical                            was performed, and patient medications and                            allergies were reviewed. The patient's tolerance of                            previous anesthesia was also reviewed. The risks                            and benefits of the procedure and the sedation                            options and risks were discussed with the patient.                            All questions were answered, and informed consent                            was obtained. Prior Anticoagulants: The patient has                            taken no previous anticoagulant or antiplatelet                            agents. ASA Grade Assessment: II - A patient with                            mild systemic disease. After reviewing the risks                            and benefits, the patient was deemed in                            satisfactory condition to undergo the procedure.  After obtaining informed consent, the endoscope was                            passed under direct vision. Throughout the                            procedure, the patient's blood pressure, pulse, and                            oxygen saturations were monitored continuously. The                            GIF [id number] was introduced through the                             mouth, and advanced to the second part of duodenum.                            The upper GI endoscopy was accomplished without                            difficulty. The patient tolerated the procedure                            well. Scope In: Scope Out: Findings:                 The esophagus was normal.                           The stomach was normal, save moderate hiatal                            hernia. Biopsies were taken with a cold forceps,                            from the gastric antrum, for histology.                           The examined duodenum was normal.                           The cardia and gastric fundus were normal on                            retroflexion. Complications:            No immediate complications. Estimated Blood Loss:     Estimated blood loss: none. Impression:               - Normal esophagus.                           - Normal stomach. Biopsied.                           - Normal examined duodenum. Recommendation:           -  Patient has a contact number available for                            emergencies. The signs and symptoms of potential                            delayed complications were discussed with the                            patient. Return to normal activities tomorrow.                            Written discharge instructions were provided to the                            patient.                           - Resume previous diet.                           - Continue present medications.                           - Await pathology results. Docia Chuck. Henrene Pastor, MD 06/08/2022 10:26:56 AM This report has been signed electronically.

## 2022-06-09 ENCOUNTER — Telehealth: Payer: Self-pay | Admitting: *Deleted

## 2022-06-09 NOTE — Telephone Encounter (Signed)
  Follow up Call-    Row Labels 06/08/2022    9:52 AM  Call back number   Section Header. No data exists in this row.   Post procedure Call Back phone  #   [phone]  Permission to leave phone message   Yes     Patient questions:  Do you have a fever, pain , or abdominal swelling? No. Pain Score  0 *  Have you tolerated food without any problems? Yes.    Have you been able to return to your normal activities? Yes.    Do you have any questions about your discharge instructions: Diet   No. Medications  No. Follow up visit  No.  Do you have questions or concerns about your Care? No.  Actions: * If pain score is 4 or above: No action needed, pain <4.

## 2022-06-17 ENCOUNTER — Other Ambulatory Visit: Payer: Self-pay

## 2022-06-17 MED ORDER — OMEPRAZOLE 20 MG PO CPDR: 20.0000 mg | DELAYED_RELEASE_CAPSULE | Freq: Two times a day (BID) | ORAL | 0 refills | Status: AC

## 2022-06-17 MED ORDER — BISMUTH 262 MG PO CHEW: 524.0000 mg | CHEWABLE_TABLET | Freq: Four times a day (QID) | ORAL | 0 refills | Status: AC

## 2022-06-17 MED ORDER — METRONIDAZOLE 250 MG PO TABS: 250.0000 mg | ORAL_TABLET | Freq: Four times a day (QID) | ORAL | 0 refills | Status: DC

## 2022-06-17 MED ORDER — DOXYCYCLINE HYCLATE 100 MG PO CAPS: 100.0000 mg | ORAL_CAPSULE | Freq: Two times a day (BID) | ORAL | 0 refills | Status: DC

## 2022-11-18 ENCOUNTER — Ambulatory Visit: Payer: Medicare Other | Admitting: Gastroenterology

## 2022-12-07 ENCOUNTER — Ambulatory Visit (HOSPITAL_COMMUNITY): Payer: 59 | Attending: Cardiovascular Disease

## 2022-12-07 ENCOUNTER — Encounter (HOSPITAL_COMMUNITY): Payer: Self-pay | Admitting: Cardiovascular Disease

## 2022-12-08 ENCOUNTER — Encounter: Payer: Self-pay | Admitting: Internal Medicine

## 2022-12-08 ENCOUNTER — Ambulatory Visit (INDEPENDENT_AMBULATORY_CARE_PROVIDER_SITE_OTHER): Payer: 59 | Admitting: Internal Medicine

## 2022-12-08 VITALS — BP 136/82 | HR 68 | Ht 63.0 in | Wt 110.4 lb

## 2022-12-08 DIAGNOSIS — K2970 Gastritis, unspecified, without bleeding: Secondary | ICD-10-CM

## 2022-12-08 DIAGNOSIS — K219 Gastro-esophageal reflux disease without esophagitis: Secondary | ICD-10-CM | POA: Diagnosis not present

## 2022-12-08 DIAGNOSIS — A048 Other specified bacterial intestinal infections: Secondary | ICD-10-CM | POA: Diagnosis not present

## 2022-12-08 DIAGNOSIS — Z1211 Encounter for screening for malignant neoplasm of colon: Secondary | ICD-10-CM | POA: Diagnosis not present

## 2022-12-08 DIAGNOSIS — R634 Abnormal weight loss: Secondary | ICD-10-CM

## 2022-12-08 DIAGNOSIS — R1013 Epigastric pain: Secondary | ICD-10-CM

## 2022-12-08 MED ORDER — PLENVU 140 G PO SOLR: 1.0000 | Freq: Once | ORAL | 0 refills | Status: AC

## 2022-12-08 NOTE — Progress Notes (Signed)
HISTORY OF PRESENT ILLNESS:  Janice Deleon is a 67 y.o. female with multiple significant medical problems who was evaluated in the office May 11, 2022 regarding postprandial abdominal pain and weight loss she had improved after PPI therapy.  Also positive H. pylori breath testing for which she was treated chronic GERD.  She subsequently underwent upper endoscopy June 08, 2022.  Examination was normal.  Gastric biopsies revealed Helicobacter pylori.  She was treated with 2-week course of Pylera.  She tells me that subsequent testing for H. pylori I demonstrated eradication.  She continues on PPI in the form of omeprazole 20 mg p.o. twice daily.  She presents today for follow-up.  She is accompanied by her daughter.  Patient is pleased to report that she has had no further problems with abdominal pain.  No active reflux symptoms.  She states that her appetite has improved and she has had some modest weight gain several pounds.  Her GI review of systems is otherwise negative.  I did question her regarding colon cancer screening as she was planning on doing Cologuard testing.  This was not done.  She tells me that she is interested in screening colonoscopy.  She thinks maybe her father had colon cancer at advanced age (96s), but not entirely sure.  Blood work from June 2023 with hemoglobin 12.8.  CT scan from June 2023 without acute abnormalities.  REVIEW OF SYSTEMS:  All non-GI ROS negative entirely  Past Medical History:  Diagnosis Date   Anemia    Aortic atherosclerosis    Aorto-iliac atherosclerosis    Cancer    Chronic hepatitis C    Chronic kidney disease    GERD (gastroesophageal reflux disease)    H. pylori infection    H/O kidney removal    History of cervical cancer    History of cocaine abuse    Hyperlipidemia    Hypertension    Major depressive disorder    Vitamin D deficiency     Past Surgical History:  Procedure Laterality Date   ABDOMINAL HYSTERECTOMY      BOWEL RESECTION     cancer of the uterus     CARPAL TUNNEL RELEASE Right 07/04/2021   Procedure: CARPAL TUNNEL RELEASE;  Surgeon: Frederico Hamman, MD;  Location: [facility];  Service: Orthopedics;  Laterality: Right;   colonscopy      at the age of 37   right nephrectomy      SHOULDER ARTHROSCOPY WITH OPEN ROTATOR CUFF REPAIR AND DISTAL CLAVICLE ACROMINECTOMY Right 07/04/2021   Procedure: SHOULDER ARTHROSCOPY WITH DEBRIDEMENT SUBACROMIAL DECOMPRESSION;  Surgeon: Frederico Hamman, MD;  Location: [facility];  Service: Orthopedics;  Laterality: Right;    Social History Janice Deleon  reports that she has never smoked. She has never used smokeless tobacco. She reports that she does not currently use drugs after having used the following drugs: Marijuana. She reports that she does not drink alcohol.  family history includes Cancer in her father; Colon cancer in her father; Diabetes in her brother, mother, and sister; Heart disease in her mother and sister; Heart failure in her mother and sister; Hypertension in her brother, father, mother, and sister; Stroke in her maternal aunt.  Allergies  Allergen Reactions   Hydrocodone-Acetaminophen Itching       PHYSICAL EXAMINATION: Vital signs: BP 136/82   Pulse 68   Ht 5\' 3"  (1.6 m)   Wt 110 lb 6.4 oz (50.1 kg)   LMP 08/31/1998   BMI 19.56 kg/m  Constitutional: Quite thin but otherwise generally well-appearing, no acute distress Psychiatric: alert and oriented x3, cooperative Eyes: extraocular movements intact, anicteric, conjunctiva pink Mouth: oral pharynx moist, no lesions Neck: supple no lymphadenopathy Cardiovascular: heart regular rate and rhythm, no murmur Lungs: clear to auscultation bilaterally Abdomen: soft, nontender, nondistended, no obvious ascites, no peritoneal signs, normal bowel sounds, no organomegaly Rectal: Deferred to colonoscopy Extremities: no clubbing, cyanosis, or lower extremity edema bilaterally Skin: no lesions  on visible extremities Neuro: No focal deficits.  Cranial nerves intact  ASSESSMENT:  1.  GERD.  Asymptomatic on PPI 2.  History of H. pylori gastritis.  Successfully treated 3.  Problems with abdominal pain and weight loss.  Resolved 4.  Colon cancer screening.  The patient is interested in colonoscopy 5.  Questionable history of colon cancer in her father at advanced age   PLAN:  1.  Reflux precautions 2.  Continue omeprazole 20 mg p.o. twice daily or 40 mg once daily.  Prescription refilled.  Medication risks reviewed 3.  Screening colonoscopy.The nature of the procedure, as well as the risks, benefits, and alternatives were carefully and thoroughly reviewed with the patient. Ample time for discussion and questions allowed. The patient understood, was satisfied, and agreed to proceed. 4.  Ongoing general medical care with PCP. Total time of 40 minutes was spent preparing to see the patient, reviewing data, obtaining history, performing medically appropriate physical exam, counseling and educating the patient and her daughter regarding the above listed issues, ordering medication, and ordering endoscopic procedure.  Finally, documenting clinical information in the health record

## 2022-12-08 NOTE — Patient Instructions (Signed)
_______________________________________________________  If your blood pressure at your visit was 140/90 or greater, please contact your primary care physician to follow up on this.  _______________________________________________________  If you are age 65 or older, your body mass index should be between 23-30. Your Body mass index is 19.56 kg/m. If this is out of the aforementioned range listed, please consider follow up with your Primary Care Provider.  If you are age 36 or younger, your body mass index should be between 19-25. Your Body mass index is 19.56 kg/m. If this is out of the aformentioned range listed, please consider follow up with your Primary Care Provider.   ________________________________________________________  The [facility] providers would like to encourage you to use [facility] to communicate with providers for non-urgent requests or questions.  Due to long hold times on the telephone, sending your provider a message by [facility] may be a faster and more efficient way to get a response.  Please allow 48 business hours for a response.  Please remember that this is for non-urgent requests.  _______________________________________________________  Janice Deleon have been scheduled for a colonoscopy. Please follow written instructions given to you at your visit today.  Please pick up your prep supplies at the pharmacy within the next 1-3 days. If you use inhalers (even only as needed), please bring them with you on the day of your procedure.

## 2022-12-11 ENCOUNTER — Other Ambulatory Visit: Payer: Self-pay | Admitting: Nurse Practitioner

## 2022-12-11 DIAGNOSIS — I739 Peripheral vascular disease, unspecified: Secondary | ICD-10-CM

## 2022-12-21 ENCOUNTER — Telehealth: Payer: Self-pay | Admitting: Internal Medicine

## 2022-12-21 NOTE — Telephone Encounter (Signed)
PT prescribed Plenvu for colonoscopy and she stated it is too expensive. Looking for another option. Please advise.

## 2022-12-22 NOTE — Telephone Encounter (Signed)
Spoke with patient and told her I would leave a Plenvu sample up front to be picked up.  Patient agreed.  

## 2022-12-25 NOTE — Telephone Encounter (Signed)
Will be sending Ms. Janice Deleon to pick up Plenvu sample.

## 2023-01-04 ENCOUNTER — Ambulatory Visit
Admission: RE | Admit: 2023-01-04 | Discharge: 2023-01-04 | Disposition: A | Discharge status: Home / Self Care | Payer: 59 | Source: Ambulatory Visit | Attending: Nurse Practitioner | Admitting: Nurse Practitioner

## 2023-01-04 DIAGNOSIS — I739 Peripheral vascular disease, unspecified: Secondary | ICD-10-CM

## 2023-01-19 ENCOUNTER — Encounter: Payer: Self-pay | Admitting: Internal Medicine

## 2023-01-19 ENCOUNTER — Ambulatory Visit (AMBULATORY_SURGERY_CENTER): Payer: 59 | Admitting: Internal Medicine

## 2023-01-19 VITALS — BP 169/97 | HR 66 | Temp 97.8°F | Resp 12 | Ht 63.0 in | Wt 110.0 lb

## 2023-01-19 DIAGNOSIS — Z1211 Encounter for screening for malignant neoplasm of colon: Secondary | ICD-10-CM | POA: Diagnosis not present

## 2023-01-19 DIAGNOSIS — D125 Benign neoplasm of sigmoid colon: Secondary | ICD-10-CM

## 2023-01-19 MED ORDER — SODIUM CHLORIDE 0.9 % IV SOLN
500.0000 mL | Freq: Once | INTRAVENOUS | Status: AC
Start: 2023-01-19 — End: ?

## 2023-01-19 NOTE — Progress Notes (Signed)
HISTORY OF PRESENT ILLNESS:   Janice Deleon is a 67 y.o. female with multiple significant medical problems who was evaluated in the office May 11, 2022 regarding postprandial abdominal pain and weight loss she had improved after PPI therapy.  Also positive H. pylori breath testing for which she was treated chronic GERD.  She subsequently underwent upper endoscopy June 08, 2022.  Examination was normal.  Gastric biopsies revealed Helicobacter pylori.  She was treated with 2-week course of Pylera.  She tells me that subsequent testing for H. pylori I demonstrated eradication.  She continues on PPI in the form of omeprazole 20 mg p.o. twice daily.  She presents today for follow-up.  She is accompanied by her daughter.   Patient is pleased to report that she has had no further problems with abdominal pain.  No active reflux symptoms.  She states that her appetite has improved and she has had some modest weight gain several pounds.  Her GI review of systems is otherwise negative.  I did question her regarding colon cancer screening as she was planning on doing Cologuard testing.  This was not done.  She tells me that she is interested in screening colonoscopy.  She thinks maybe her father had colon cancer at advanced age (63s), but not entirely sure.   Blood work from June 2023 with hemoglobin 12.8.  CT scan from June 2023 without acute abnormalities.   REVIEW OF SYSTEMS:   All non-GI ROS negative entirely       Past Medical History:  Diagnosis Date   Anemia     Aortic atherosclerosis     Aorto-iliac atherosclerosis     Cancer     Chronic hepatitis C     Chronic kidney disease     GERD (gastroesophageal reflux disease)     H. pylori infection     H/O kidney removal     History of cervical cancer     History of cocaine abuse     Hyperlipidemia     Hypertension     Major depressive disorder     Vitamin D deficiency             Past Surgical History:  Procedure Laterality Date    ABDOMINAL HYSTERECTOMY       BOWEL RESECTION       cancer of the uterus       CARPAL TUNNEL RELEASE Right 07/04/2021    Procedure: CARPAL TUNNEL RELEASE;  Surgeon: Frederico Hamman, MD;  Location: [facility];  Service: Orthopedics;  Laterality: Right;   colonscopy         at the age of 34   right nephrectomy        SHOULDER ARTHROSCOPY WITH OPEN ROTATOR CUFF REPAIR AND DISTAL CLAVICLE ACROMINECTOMY Right 07/04/2021    Procedure: SHOULDER ARTHROSCOPY WITH DEBRIDEMENT SUBACROMIAL DECOMPRESSION;  Surgeon: Frederico Hamman, MD;  Location: [facility];  Service: Orthopedics;  Laterality: Right;      Social History Janice Deleon  reports that she has never smoked. She has never used smokeless tobacco. She reports that she does not currently use drugs after having used the following drugs: Marijuana. She reports that she does not drink alcohol.   family history includes Cancer in her father; Colon cancer in her father; Diabetes in her brother, mother, and sister; Heart disease in her mother and sister; Heart failure in her mother and sister; Hypertension in her brother, father, mother, and sister; Stroke in her maternal aunt.  Allergies  Allergen Reactions   Hydrocodone-Acetaminophen Itching          PHYSICAL EXAMINATION: Vital signs: BP 136/82   Pulse 68   Ht 5\' 3"  (1.6 m)   Wt 110 lb 6.4 oz (50.1 kg)   LMP 08/31/1998   BMI 19.56 kg/m   Constitutional: Quite thin but otherwise generally well-appearing, no acute distress Psychiatric: alert and oriented x3, cooperative Eyes: extraocular movements intact, anicteric, conjunctiva pink Mouth: oral pharynx moist, no lesions Neck: supple no lymphadenopathy Cardiovascular: heart regular rate and rhythm, no murmur Lungs: clear to auscultation bilaterally Abdomen: soft, nontender, nondistended, no obvious ascites, no peritoneal signs, normal bowel sounds, no organomegaly Rectal: Deferred to colonoscopy Extremities: no clubbing, cyanosis,  or lower extremity edema bilaterally Skin: no lesions on visible extremities Neuro: No focal deficits.  Cranial nerves intact   ASSESSMENT:   1.  GERD.  Asymptomatic on PPI 2.  History of H. pylori gastritis.  Successfully treated 3.  Problems with abdominal pain and weight loss.  Resolved 4.  Colon cancer screening.  The patient is interested in colonoscopy 5.  Questionable history of colon cancer in her father at advanced age     PLAN:   1.  Reflux precautions 2.  Continue omeprazole 20 mg p.o. twice daily or 40 mg once daily.  Prescription refilled.  Medication risks reviewed 3.  Screening colonoscopy.The nature of the procedure, as well as the risks, benefits, and alternatives were carefully and thoroughly reviewed with the patient. Ample time for discussion and questions allowed. The patient understood, was satisfied, and agreed to proceed. 4.  Ongoing general medical care with PCP.  Recent complete H&P as outlined above.  No interval changes.  Now for colonoscopy

## 2023-01-19 NOTE — Patient Instructions (Signed)
Discharge instructions given. Handout on polyps. Resume previous medications. YOU HAD AN ENDOSCOPIC PROCEDURE TODAY AT THE  ENDOSCOPY CENTER:   Refer to the procedure report that was given to you for any specific questions about what was found during the examination.  If the procedure report does not answer your questions, please call your gastroenterologist to clarify.  If you requested that your care partner not be given the details of your procedure findings, then the procedure report has been included in a sealed envelope for you to review at your convenience later.  YOU SHOULD EXPECT: Some feelings of bloating in the abdomen. Passage of more gas than usual.  Walking can help get rid of the air that was put into your GI tract during the procedure and reduce the bloating. If you had a lower endoscopy (such as a colonoscopy or flexible sigmoidoscopy) you may notice spotting of blood in your stool or on the toilet paper. If you underwent a bowel prep for your procedure, you may not have a normal bowel movement for a few days.  Please Note:  You might notice some irritation and congestion in your nose or some drainage.  This is from the oxygen used during your procedure.  There is no need for concern and it should clear up in a day or so.  SYMPTOMS TO REPORT IMMEDIATELY:  Following lower endoscopy (colonoscopy or flexible sigmoidoscopy):  Excessive amounts of blood in the stool  Significant tenderness or worsening of abdominal pains  Swelling of the abdomen that is new, acute  Fever of 100F or higher   For urgent or emergent issues, a gastroenterologist can be reached at any hour by calling [phone]. Do not use MyChart messaging for urgent concerns.    DIET:  We do recommend a small meal at first, but then you may proceed to your regular diet.  Drink plenty of fluids but you should avoid alcoholic beverages for 24 hours.  ACTIVITY:  You should plan to take it easy for the rest  of today and you should NOT DRIVE or use heavy machinery until tomorrow (because of the sedation medicines used during the test).    FOLLOW UP: Our staff will call the number listed on your records the next business day following your procedure.  We will call around 7:15- 8:00 am to check on you and address any questions or concerns that you may have regarding the information given to you following your procedure. If we do not reach you, we will leave a message.     If any biopsies were taken you will be contacted by phone or by letter within the next 1-3 weeks.  Please call us at [phone] if you have not heard about the biopsies in 3 weeks.    SIGNATURES/CONFIDENTIALITY: You and/or your care partner have signed paperwork which will be entered into your electronic medical record.  These signatures attest to the fact that that the information above on your After Visit Summary has been reviewed and is understood.  Full responsibility of the confidentiality of this discharge information lies with you and/or your care-partner.   

## 2023-01-19 NOTE — Progress Notes (Signed)
Pt's states no medical or surgical changes since previsit or office visit. 

## 2023-01-19 NOTE — Op Note (Signed)
[facility] Patient Name: Janice Deleon Procedure Date: 01/19/2023 11:08 AM MRN: [id number] Endoscopist: Wilhemina Bonito. Marina Goodell , MD, [id number] Age: 67 Referring MD:  Date of Birth: Sep 19, 1955 Gender: Female Account #: [id number] Procedure:                Colonoscopy with cold snare polypectomy x 1 Indications:              Screening for colorectal malignant neoplasm Medicines:                Monitored Anesthesia Care Procedure:                Pre-Anesthesia Assessment:                           - Prior to the procedure, a History and Physical                            was performed, and patient medications and                            allergies were reviewed. The patient's tolerance of                            previous anesthesia was also reviewed. The risks                            and benefits of the procedure and the sedation                            options and risks were discussed with the patient.                            All questions were answered, and informed consent                            was obtained. Prior Anticoagulants: The patient has                            taken no anticoagulant or antiplatelet agents. ASA                            Grade Assessment: II - A patient with mild systemic                            disease. After reviewing the risks and benefits,                            the patient was deemed in satisfactory condition to                            undergo the procedure.                           After obtaining informed consent, the colonoscope  was passed under direct vision. Throughout the                            procedure, the patient's blood pressure, pulse, and                            oxygen saturations were monitored continuously. The                            Olympus SN [id number] was introduced through the anus                            and advanced to the the cecum, identified by                             appendiceal orifice and ileocecal valve. The                            ileocecal valve, appendiceal orifice, and rectum                            were photographed. The quality of the bowel                            preparation was excellent. The colonoscopy was                            performed without difficulty. The patient tolerated                            the procedure well. The bowel preparation used was                            SUPREP via split dose instruction. Scope In: 11:29:44 AM Scope Out: 11:40:04 AM Scope Withdrawal Time: 0 hours 8 minutes 37 seconds  Total Procedure Duration: 0 hours 10 minutes 20 seconds  Findings:                 A 5 mm polyp was found in the sigmoid colon. The                            polyp was removed with a cold snare. Resection and                            retrieval were complete.                           The exam was otherwise without abnormality. No                            retroflexion due to narrow rectal vault. Complications:            No immediate complications. Estimated blood loss:  None. Estimated Blood Loss:     Estimated blood loss: none. Impression:               - One 5 mm polyp in the sigmoid colon, removed with                            a cold snare. Resected and retrieved.                           - The examination was otherwise normal. No                            retroflexion due to narrow rectal vault. However,                            excellent image of rectum from anal os obtained. Recommendation:           - Repeat colonoscopy in 7 years for surveillance.                           - Patient has a contact number available for                            emergencies. The signs and symptoms of potential                            delayed complications were discussed with the                            patient. Return to normal activities tomorrow.                             Written discharge instructions were provided to the                            patient.                           - Resume previous diet.                           - Continue present medications.                           - Await pathology results. Wilhemina Bonito. Marina Goodell, MD 01/19/2023 11:45:36 AM This report has been signed electronically.

## 2023-01-19 NOTE — Progress Notes (Signed)
Vss nad trans to pacu 

## 2023-01-19 NOTE — Progress Notes (Signed)
Called to room to assist during endoscopic procedure.  Patient ID and intended procedure confirmed with present staff. Received instructions for my participation in the procedure from the performing physician.  

## 2023-01-20 ENCOUNTER — Telehealth: Payer: Self-pay

## 2023-01-20 NOTE — Telephone Encounter (Signed)
  Follow up Call-     01/19/2023   10:40 AM 06/08/2022    9:52 AM  Call back number  Post procedure Call Back phone  # [phone]  Permission to leave phone message Yes Yes     Patient questions:  Do you have a fever, pain , or abdominal swelling? No. Pain Score  0 *  Have you tolerated food without any problems? Yes.    Have you been able to return to your normal activities? Yes.    Do you have any questions about your discharge instructions: Diet   No. Medications  No. Follow up visit  No.  Do you have questions or concerns about your Care? No.  Actions: * If pain score is 4 or above: No action needed, pain <4.

## 2023-01-22 ENCOUNTER — Encounter: Payer: Self-pay | Admitting: Internal Medicine

## 2023-08-07 ENCOUNTER — Emergency Department (HOSPITAL_BASED_OUTPATIENT_CLINIC_OR_DEPARTMENT_OTHER)
Admission: EM | Admit: 2023-08-07 | Discharge: 2023-08-07 | Disposition: A | Discharge status: Home / Self Care | Payer: 59 | Attending: Emergency Medicine | Admitting: Emergency Medicine

## 2023-08-07 ENCOUNTER — Encounter (HOSPITAL_BASED_OUTPATIENT_CLINIC_OR_DEPARTMENT_OTHER): Payer: Self-pay

## 2023-08-07 ENCOUNTER — Emergency Department (HOSPITAL_BASED_OUTPATIENT_CLINIC_OR_DEPARTMENT_OTHER): Payer: 59

## 2023-08-07 DIAGNOSIS — M79604 Pain in right leg: Secondary | ICD-10-CM | POA: Insufficient documentation

## 2023-08-07 MED ORDER — HYDROCHLOROTHIAZIDE 25 MG PO TABS
25.0000 mg | ORAL_TABLET | Freq: Once | ORAL | Status: AC
  Administered 2023-08-07: 25 mg via ORAL
  Filled 2023-08-07: qty 1

## 2023-08-07 MED ORDER — AMLODIPINE BESYLATE 5 MG PO TABS
10.0000 mg | ORAL_TABLET | Freq: Once | ORAL | Status: AC
  Administered 2023-08-07: 10 mg via ORAL
  Filled 2023-08-07: qty 2

## 2023-08-07 MED ORDER — LISINOPRIL 10 MG PO TABS
10.0000 mg | ORAL_TABLET | Freq: Once | ORAL | Status: AC
  Administered 2023-08-07: 10 mg via ORAL
  Filled 2023-08-07: qty 1

## 2023-08-07 NOTE — Discharge Instructions (Signed)
Today you are seen for right leg pain.  Thank you for letting us treat you today. After performing a physical exam and reviewing your imaging, I feel you are safe to go home. Please follow up with your PCP in the next several days and provide them with your records from this visit. Return to the Emergency Room if pain becomes severe or symptoms worsen.

## 2023-08-07 NOTE — ED Triage Notes (Signed)
She tells me that she has had right leg pain and swelling "for a long time now". She saw a provider in [place] about a month ago, and after imaging/diagnostics was dx with peripheral artery disease. She c/o some increase in her right leg pain today and her pcp recommended she get an "ultrasound to see if I have a blood clot". She is ambulatory and in no distress.

## 2023-08-07 NOTE — ED Provider Notes (Signed)
[facility] EMERGENC[facility] DEPARTMENT AT [facility] Provider Note   CSN: [id number] Arrival date & time: 08/07/23  1323     History  Chief Complaint  Patient presents with   Leg Pain    Janice Deleon is a 67 y.o. female presents today for right leg pain and swelling.  Patient was seen in [place] about a month ago and was found to have peripheral artery disease and started on cilostazol.  She states that this has resolved much of her pain.  Today she saw her PCP who recommended she come here to have an ultrasound to rule out a blood clot.  Patient denies numbness, tingling, weakness, shortness of breath, or chest pain   Leg Pain      Home Medications Prior to Admission medications   Medication Sig Start Date End Date Taking? Authorizing Provider  amLODipine (NORVASC) 10 MG tablet Take 10 mg by mouth daily. 04/23/22  Yes [provider]  hydrochlorothiazide (HYDRODIURIL) 25 MG tablet Take 25 mg by mouth daily. 05/22/22  Yes [provider]  lisinopril (ZESTRIL) 10 MG tablet Take 10 mg by mouth every morning. 11/20/22  Yes [provider]  acetaminophen (TYLENOL) 500 MG tablet Take 1,000 mg by mouth every 6 (six) hours as needed for moderate pain.    [provider]  Bismuth 262 MG CHEW Chew 524 mg by mouth in the morning, at noon, in the evening, and at bedtime. Patient not taking: Reported on 01/19/2023 06/17/22   Hilarie Fredrickson, MD  gabapentin (NEURONTIN) 100 MG capsule Take 200 mg by mouth every 8 (eight) hours as needed. 11/27/22   [provider]  omeprazole (PRILOSEC) 20 MG capsule Take 1 capsule (20 mg total) by mouth 2 (two) times daily before a meal. 06/17/22   Hilarie Fredrickson, MD      Allergies    Hydrocodone-acetaminophen    Review of Systems   Review of Systems  Musculoskeletal:  Positive for arthralgias.    Physical Exam Updated Vital Signs BP (!) 228/110   Pulse 73   Temp 98.6 F (37 C)   Resp 15   LMP  08/31/1998   SpO2 100%  Physical Exam Vitals and nursing note reviewed.  Constitutional:      General: She is not in acute distress.    Appearance: She is well-developed.  HENT:     Head: Normocephalic and atraumatic.  Eyes:     Conjunctiva/sclera: Conjunctivae normal.  Cardiovascular:     Rate and Rhythm: Normal rate and regular rhythm.     Pulses: Normal pulses.     Heart sounds: No murmur heard. Pulmonary:     Effort: Pulmonary effort is normal. No respiratory distress.     Breath sounds: Normal breath sounds.  Abdominal:     Palpations: Abdomen is soft.     Tenderness: There is no abdominal tenderness.  Musculoskeletal:        General: No swelling.     Cervical back: Neck supple.     Right lower leg: No edema.     Left lower leg: No edema.     Comments: Mild tenderness to medial right thigh  Skin:    General: Skin is warm and dry.     Capillary Refill: Capillary refill takes less than 2 seconds.  Neurological:     General: No focal deficit present.     Mental Status: She is alert.  Psychiatric:        Mood and  Affect: Mood normal.     ED Results / Procedures / Treatments   Labs (all labs ordered are listed, but only abnormal results are displayed) Labs Reviewed - No data to display  EKG None  Radiology US Venous Img Lower Unilateral Right  Result Date: 08/07/2023 CLINICAL DATA:  Chronic right lower extremity edema EXAM: RIGHT LOWER EXTREMITY VENOUS DOPPLER ULTRASOUND TECHNIQUE: Gray-scale sonography with compression, as well as color and duplex ultrasound, were performed to evaluate the deep venous system(s) from the level of the common femoral vein through the popliteal and proximal calf veins. COMPARISON:  None Available. FINDINGS: VENOUS Normal compressibility of the common femoral, superficial femoral, and popliteal veins, as well as the visualized calf veins. Visualized portions of profunda femoral vein and great saphenous vein unremarkable. No filling  defects to suggest DVT on grayscale or color Doppler imaging. Doppler waveforms show normal direction of venous flow, normal respiratory plasticity and response to augmentation. Limited views of the contralateral common femoral vein are unremarkable. OTHER None. Limitations: none IMPRESSION: Negative. Electronically Signed   By: Malachy Moan M.D.   On: 08/07/2023 15:36    Procedures Procedures    Medications Ordered in ED Medications  amLODipine (NORVASC) tablet 10 mg (has no administration in time range)  lisinopril (ZESTRIL) tablet 10 mg (has no administration in time range)  hydrochlorothiazide (HYDRODIURIL) tablet 25 mg (has no administration in time range)    ED Course/ Medical Decision Making/ A&P                                 Medical Decision Making  This patient presents to the ED with chief complaint(s) of right leg pain with pertinent past medical history of PAD which further complicates the presenting complaint. The complaint involves an extensive differential diagnosis and also carries with it a high risk of complications and morbidity.    The differential diagnosis includes DVT, PAD   ED Course and Reassessment:  Independent visualization of imaging: - I independently visualized the following imaging with scope of interpretation limited to determining acute life threatening conditions related to emergency care: Ultrasound venous and manage lower right, which revealed negative  Consultation: - Consulted or discussed management/test interpretation w/ external professional: None  Consideration for admission or further workup: Considered for admission or further workup however patient states that her pain is under control while using the cilostazol.  Patient is hypertensive but reports that she forgot to take all of her hyper pretension medications today.  Patient was given her usual hypertension regimen prior to discharge.  Patient denies headache or any other  symptoms at this time.        Final Clinical Impression(s) / ED Diagnoses Final diagnoses:  Right leg pain    Rx / DC Orders ED Discharge Orders     None         Dolphus Jenny, PA-C 08/07/23 1619    Virgina Norfolk, DO 08/07/23 1903

## 2023-08-07 NOTE — ED Notes (Signed)
Patient given discharge instructions. Questions were answered. Patient verbalized understanding of discharge instructions and care at home.  

## 2023-09-17 ENCOUNTER — Other Ambulatory Visit: Payer: Self-pay

## 2023-09-17 DIAGNOSIS — I739 Peripheral vascular disease, unspecified: Secondary | ICD-10-CM

## 2023-09-22 NOTE — Progress Notes (Signed)
Patient ID: Janice Deleon, female   DOB: June 09, 1956, 68 y.o.   MRN: [id number]  Reason for Consult: No chief complaint on file.   Referred by Hillery Aldo, NP  Subjective:     HPI  Janice Deleon is a 68 y.o. female presenting for evaluation of leg pain.  She reports she has had right leg pain that starts in her hip and travels down to her foot since November.  She she also describes weakness and fatigue in the leg.  Recently it has also started in the left leg with the same characterization of starting at the low back/hip.  She denies calf pain with ambulation.  She denies rest pain or nonhealing wounds.  Past Medical History:  Diagnosis Date   Allergy    Anemia    Aortic atherosclerosis (HCC)    Aorto-iliac atherosclerosis (HCC)    Cancer (HCC)    Chronic hepatitis C (HCC)    Chronic kidney disease    GERD (gastroesophageal reflux disease)    H. pylori infection    H/O kidney removal    History of cervical cancer    History of cocaine abuse (HCC)    Hyperlipidemia    Hypertension    Major depressive disorder    Vitamin D deficiency    Family History  Problem Relation Age of Onset   Heart failure Mother    Hypertension Mother    Diabetes Mother    Heart disease Mother    Colon cancer Father    Hypertension Father    Cancer Father        colon cancer    Heart failure Sister    Heart disease Sister    Hypertension Sister    Diabetes Sister    Hypertension Brother    Diabetes Brother    Stroke Maternal Aunt    Stomach cancer Neg Hx    Esophageal cancer Neg Hx    Colon polyps Neg Hx    Past Surgical History:  Procedure Laterality Date   ABDOMINAL HYSTERECTOMY     BOWEL RESECTION     cancer of the uterus     CARPAL TUNNEL RELEASE Right 07/04/2021   Procedure: CARPAL TUNNEL RELEASE;  Surgeon: Frederico Hamman, MD;  Location: [facility];  Service: Orthopedics;  Laterality: Right;   colonscopy      at the age of 34   right nephrectomy      SHOULDER  ARTHROSCOPY WITH OPEN ROTATOR CUFF REPAIR AND DISTAL CLAVICLE ACROMINECTOMY Right 07/04/2021   Procedure: SHOULDER ARTHROSCOPY WITH DEBRIDEMENT SUBACROMIAL DECOMPRESSION;  Surgeon: Frederico Hamman, MD;  Location: [facility];  Service: Orthopedics;  Laterality: Right;    Short Social History:  Social History   Tobacco Use   Smoking status: Never   Smokeless tobacco: Never  Substance Use Topics   Alcohol use: No    Allergies  Allergen Reactions   Hydrocodone-Acetaminophen Itching    Current Outpatient Medications  Medication Sig Dispense Refill   acetaminophen (TYLENOL) 500 MG tablet Take 1,000 mg by mouth every 6 (six) hours as needed for moderate pain.     amLODipine (NORVASC) 10 MG tablet Take 10 mg by mouth daily.     Bismuth 262 MG CHEW Chew 524 mg by mouth in the morning, at noon, in the evening, and at bedtime. (Patient not taking: Reported on 01/19/2023) 112 tablet 0   gabapentin (NEURONTIN) 100 MG capsule Take 200 mg by mouth every 8 (eight) hours as needed.  hydrochlorothiazide (HYDRODIURIL) 25 MG tablet Take 25 mg by mouth daily.     lisinopril (ZESTRIL) 10 MG tablet Take 10 mg by mouth every morning.     omeprazole (PRILOSEC) 20 MG capsule Take 1 capsule (20 mg total) by mouth 2 (two) times daily before a meal. 28 capsule 0   Current Facility-Administered Medications  Medication Dose Route Frequency Provider Last Rate Last Admin   0.9 %  sodium chloride infusion  500 mL Intravenous Once Hilarie Fredrickson, MD        REVIEW OF SYSTEMS   All other systems were reviewed and are negative     Objective:  Objective   There were no vitals filed for this visit. There is no height or weight on file to calculate BMI.  Physical Exam General: no acute distress Cardiac: hemodynamically stable Pulm: normal work of breathing Abdomen: non-tender, no pulsatile mass Neuro: alert, no focal deficit Extremities: no edema, cyanosis or wounds Vascular:   Right: Palpable DP  Left:  Palpable DP   Data: ABI +---------+------------------+-----+---------+--------+  Right   Rt Pressure (mmHg)IndexWaveform Comment   +---------+------------------+-----+---------+--------+  Brachial 185                                       +---------+------------------+-----+---------+--------+  PTA     186               1.01 triphasic          +---------+------------------+-----+---------+--------+  DP      190               1.03 triphasic          +---------+------------------+-----+---------+--------+  Great Toe120               0.65 Abnormal           +---------+------------------+-----+---------+--------+   +---------+------------------+-----+---------+-------+  Left    Lt Pressure (mmHg)IndexWaveform Comment  +---------+------------------+-----+---------+-------+  Brachial 183                                      +---------+------------------+-----+---------+-------+  PTA     193               1.04 triphasic         +---------+------------------+-----+---------+-------+  DP      183               0.99 triphasic         +---------+------------------+-----+---------+-------+  Great Toe162               0.88 Normal            +---------+------------------+-----+---------+-------+   Right leg DVT study reviewed Negative  Echo reviewed EF 60 to 65%, no RWMA, grade 1 diastolic dysfunction, RSVP normal, no valvular abnormalities     Assessment/Plan:     Janice Deleon is a 68 y.o. female with radiculopathy.  I explained that the perfusion to her bilateral lower extremities essentially normal and that the pain shooting down from her back and hip to her foot is typically sciatica pain related to radiculopathy.  Referred to spine surgery Follow-up as needed     Daria Pastures MD [facility]

## 2023-09-24 ENCOUNTER — Ambulatory Visit (HOSPITAL_COMMUNITY)
Admission: RE | Admit: 2023-09-24 | Discharge: 2023-09-24 | Disposition: A | Discharge status: Home / Self Care | Payer: 59 | Source: Ambulatory Visit | Attending: Vascular Surgery | Admitting: Vascular Surgery

## 2023-09-24 ENCOUNTER — Encounter: Payer: Self-pay | Admitting: Vascular Surgery

## 2023-09-24 ENCOUNTER — Ambulatory Visit (INDEPENDENT_AMBULATORY_CARE_PROVIDER_SITE_OTHER): Payer: 59 | Admitting: Vascular Surgery

## 2023-09-24 VITALS — BP 192/105 | HR 61 | Temp 98.7°F | Resp 20 | Ht 63.0 in | Wt 117.0 lb

## 2023-09-24 DIAGNOSIS — I739 Peripheral vascular disease, unspecified: Secondary | ICD-10-CM

## 2023-09-24 DIAGNOSIS — M5417 Radiculopathy, lumbosacral region: Secondary | ICD-10-CM | POA: Diagnosis not present

## 2023-09-24 LAB — VAS US ABI WITH/WO TBI
Left ABI: 1.04
Right ABI: 1.03

## 2023-10-11 ENCOUNTER — Ambulatory Visit: Payer: 59 | Admitting: Orthopedic Surgery

## 2023-10-11 ENCOUNTER — Telehealth: Payer: Self-pay | Admitting: Orthopedic Surgery

## 2023-10-11 NOTE — Telephone Encounter (Signed)
 Pt called need a call back to r/s new pt 30 min appt. Pt said she can do tomorrow or next week per Chirsty. Pt phone number is [phone].

## 2023-10-11 NOTE — Telephone Encounter (Signed)
 I called and got her in with Dr. Sulema Endo 10/12/23

## 2023-10-12 ENCOUNTER — Other Ambulatory Visit: Payer: Self-pay

## 2023-10-12 ENCOUNTER — Ambulatory Visit (INDEPENDENT_AMBULATORY_CARE_PROVIDER_SITE_OTHER): Payer: 59 | Admitting: Orthopedic Surgery

## 2023-10-12 ENCOUNTER — Other Ambulatory Visit (INDEPENDENT_AMBULATORY_CARE_PROVIDER_SITE_OTHER): Payer: 59

## 2023-10-12 VITALS — BP 208/88 | HR 73 | Ht 63.0 in | Wt 117.0 lb

## 2023-10-12 DIAGNOSIS — M5416 Radiculopathy, lumbar region: Secondary | ICD-10-CM

## 2023-10-12 DIAGNOSIS — M545 Low back pain, unspecified: Secondary | ICD-10-CM | POA: Diagnosis not present

## 2023-10-12 DIAGNOSIS — M542 Cervicalgia: Secondary | ICD-10-CM

## 2023-10-12 MED ORDER — PREGABALIN 75 MG PO CAPS: 75.0000 mg | ORAL_CAPSULE | Freq: Two times a day (BID) | ORAL | 1 refills | Status: AC

## 2023-10-12 NOTE — Progress Notes (Addendum)
Orthopedic Spine Surgery Office Note  Assessment: Patient is a 68 y.o. female with low back pain that radiates into the right lower extremity.  No significant stenosis seen on her lumbar MRI   Plan: -Explained that initially conservative treatment is tried as a significant number of patients may experience relief with these treatment modalities. Discussed that the conservative treatments include:  -activity modification  -physical therapy  -over the counter pain medications  -medrol dosepak  -lumbar steroid injections -Patient has tried Tylenol -Recommended Lyrica which was prescribed today.  Also recommended physical therapy, referral provided to her today -Could consider EMG/NCS in the future for further work up if empiric treatment does not give her relief -Patient should return to office in 6 weeks, x-rays at next visit: None   Patient expressed understanding of the plan and all questions were answered to the patient's satisfaction.   ___________________________________________________________________________   History:  Patient is a 68 y.o. female who presents today for lumbar spine. Patient has had 3 months of low back pain that radiates into the right lower extremity.  She feels a going along the lateral and posterior aspects of the thigh and leg to the level of the ankle.  She rarely has pain into the left lower extremity.  Pain is felt periodically.  She does not have it on a daily basis.  She does not notice any particular movement or activity that brings on the pain.  She said sometimes she will be sitting and the pain starts.  She has seen vein and vascular for an abnormal ABI, but they did not feel that this was related to her vasculature and felt it was more radicular in nature.  Sometimes gets paresthesias and numbness in the same distribution as the pain.  No other numbness or paresthesias.   Weakness: denies Symptoms of imbalance: denies Paresthesias and numbness: yes,  sometimes along lateral aspect of the right leg Bowel or bladder incontinence: denies Saddle anesthesia: denies  Treatments tried: tylenol  Review of systems: Denies fevers and chills, night sweats, unexplained weight loss. Has pain that wakes her at night.  Has a history of uterine cancer.  Past medical history: HTN GERD Uterine cancer in remission  Allergies: hydrocodone  Past surgical history:  Hysterectomy Right nephrectomy Right carpal tunnel release Right shoulder arthroscopy with rotator cuff repair  Social history: Denies use of nicotine product (smoking, vaping, patches, smokeless) Alcohol use: denies Denies recreational drug use   Physical Exam:  General: no acute distress, appears stated age Neurologic: alert, answering questions appropriately, following commands Respiratory: unlabored breathing on room air, symmetric chest rise Psychiatric: appropriate affect, normal cadence to speech   MSK (spine):  -Strength exam      Left  Right EHL    5/5  5/5 TA    5/5  5/5 GSC    5/5  5/5 Knee extension  5/5  5/5 Hip flexion   5/5  5/5  -Sensory exam    Sensation intact to light touch in L3-S1 nerve distributions of bilateral lower extremities  -Achilles DTR: 1/4 on the left, 1/4 on the right -Patellar tendon DTR: 1/4 on the left, 1/4 on the right  -Straight leg raise: negative bilaterally -Femoral nerve stretch test: negative bilaterally -Clonus: no beats bilaterally  -Left hip exam: no pain with range of motion, negative stinchfield, negative FABER -Right hip exam: no pain with range of motion, negative stinchfield, negative FABER  Imaging: XRs of the lumbar spine from 10/12/2023 were independently reviewed and  interpreted, showing disc height loss at L4/5. No other significant degenerative changes seen. No evidence of instability on flexion/extension views. No fracture or dislocation seen.  MRI of the lumbar spine from 01/05/2023 was independently  reviewed and interpreted, showing small left paracentral disc herniation at L1/2. No significant stenosis seen.    Patient name: Janice Deleon Patient MRN: [id number] Date of visit: 10/12/23

## 2023-10-22 ENCOUNTER — Telehealth (HOSPITAL_COMMUNITY): Payer: Self-pay | Admitting: *Deleted

## 2023-10-22 NOTE — Telephone Encounter (Signed)
Duplicate referral from [facility] received requesting evaluation for PAD. Office notes from visit with Dr Hetty Blend on 09/24/2023 were faxed today, 10/22/2023. Today's duplicate referral will be deleted.

## 2023-10-26 ENCOUNTER — Other Ambulatory Visit: Payer: Self-pay

## 2023-10-26 ENCOUNTER — Ambulatory Visit: Payer: 59 | Attending: Orthopedic Surgery

## 2023-10-26 DIAGNOSIS — M6281 Muscle weakness (generalized): Secondary | ICD-10-CM

## 2023-10-26 DIAGNOSIS — G8929 Other chronic pain: Secondary | ICD-10-CM | POA: Diagnosis present

## 2023-10-26 DIAGNOSIS — M5441 Lumbago with sciatica, right side: Secondary | ICD-10-CM | POA: Diagnosis present

## 2023-10-26 DIAGNOSIS — M5416 Radiculopathy, lumbar region: Secondary | ICD-10-CM | POA: Diagnosis not present

## 2023-10-26 DIAGNOSIS — R293 Abnormal posture: Secondary | ICD-10-CM | POA: Diagnosis present

## 2023-10-26 NOTE — Therapy (Signed)
 OUTPATIENT PHYSICAL THERAPY THORACOLUMBAR EVALUATION   Patient Name: Janice Deleon MRN: [id number] DOB:[birth year], 68 y.o., female Today's Date: 10/27/2023  END OF SESSION:  PT End of Session - 10/27/23 0534     Visit Number 1    Number of Visits 13    Date for PT Re-Evaluation 12/17/23    Authorization Type [employer] D[employer]PLETE;  MEDICAID OF [place]    Authorization - Number of Visits 27    PT Start Time [phone]    PT Stop Time 1019    PT Time Calculation (min) 45 min    Activity Tolerance Patient tolerated treatment well    Behavior During Therapy WFL for tasks assessed/performed             Past Medical History:  Diagnosis Date   Allergy    Anemia    Aortic atherosclerosis (HCC)    Aorto-iliac atherosclerosis (HCC)    Cancer (HCC)    Chronic hepatitis C (HCC)    Chronic kidney disease    GERD (gastroesophageal reflux disease)    H. pylori infection    H/O kidney removal    History of cervical cancer    History of cocaine abuse (HCC)    Hyperlipidemia    Hypertension    Major depressive disorder    Vitamin D deficiency    Past Surgical History:  Procedure Laterality Date   ABDOMINAL HYSTERECTOMY     BOWEL RESECTION     cancer of the uterus     CARPAL TUNNEL RELEASE Right 07/04/2021   Procedure: CARPAL TUNNEL RELEASE;  Surgeon: Frederico Hamman, MD;  Location: [facility];  Service: Orthopedics;  Laterality: Right;   colonscopy      at the age of 35   right nephrectomy      SHOULDER ARTHROSCOPY WITH OPEN ROTATOR CUFF REPAIR AND DISTAL CLAVICLE ACROMINECTOMY Right 07/04/2021   Procedure: SHOULDER ARTHROSCOPY WITH DEBRIDEMENT SUBACROMIAL DECOMPRESSION;  Surgeon: Frederico Hamman, MD;  Location: [facility];  Service: Orthopedics;  Laterality: Right;   Patient Active Problem List   Diagnosis Date Noted   BV (bacterial vaginosis) 11/29/2014   Trichomoniasis 11/29/2014   Vitamin D deficiency 10/23/2014   Esophageal reflux 05/18/2014   HYPERLIPIDEMIA 07/19/2006    DEPRESSION 07/19/2006   Essential hypertension 07/19/2006   RADIATION PROCTITIS 07/19/2006   Abdominal pain 07/19/2006   CERVICAL CANCER, HX OF 07/19/2006   Personal history presenting hazards to health 07/19/2006   NEPHRECTOMY, HX OF 07/19/2006    PCP: Hillery Aldo, NP  REFERRING PROVIDER: London Sheer, MD   REFERRING DIAG: M54.16 (ICD-10-CM) - Radiculopathy, lumbar region   Rationale for Evaluation and Treatment: Rehabilitation  THERAPY DIAG:  Chronic right-sided low back pain with right-sided sciatica  Muscle weakness (generalized)  Abnormal posture  ONSET DATE: 8 months  SUBJECTIVE:  SUBJECTIVE STATEMENT: Pt states she experiences constant R lateral back/hip pain and intermittent lateral leg pain with N/T of the whole R foot. Pt feels the her condition is getting worse.   PERTINENT HISTORY:  See PMH  PAIN:  Are you having pain? Yes: NPRS scale: 8/10. Pain range over the week prior to the start of PT: 6-10/10 Pain location: Pt states she experiences constant R lateral back/hip pain and intermittent lateral leg pain with N/T of the whole foot Pain description: Throb Aggravating factors: Cleaning home and church Relieving factors: Sitting  PRECAUTIONS: None  RED FLAGS: None   WEIGHT BEARING RESTRICTIONS: No  FALLS:  Has patient fallen in last 6 months? No  LIVING ENVIRONMENT: Lives with: lives alone Lives in: House/apartment Able to access and be mobile in home  OCCUPATION: Retired  PLOF: Independent  PATIENT GOALS: Less pain  NEXT MD VISIT: 11/22/23  OBJECTIVE:  Note: Objective measures were completed at Evaluation unless otherwise noted.  DIAGNOSTIC FINDINGS:  XRs of the lumbar spine from 10/12/2023 were independently reviewed and  interpreted, showing disc  height loss at L4/5. No other significant  degenerative changes seen. No evidence of instability on flexion/extension  views. No fracture or dislocation seen.   PATIENT SURVEYS:  Modified Oswestry 34/50: 68%- severe disability   COGNITION: Overall cognitive status: Within functional limits for tasks assessed     SENSATION: WFL  MUSCLE LENGTH: Hamstrings: Right WNLs deg; Left WNLs deg Maisie Fus test: Right tight deg; Left WNLs deg  POSTURE: increased lumbar lordosis and decreased thoracic kyphosis  PALPATION: TTP to the R lumbar paraspinals and QL with increased muscle tension  LUMBAR ROM:   AROM eval  Flexion Min limited, mid sheen; pulling pain R LB  Extension Mod limited; R LBP  Right lateral flexion Mod limited, above mid knee; min R LBP  Left lateral flexion Min limited, mid knee; pulling R LBP  Right rotation Min limited; min LBP  Left rotation Min liited; min LBP   (Blank rows = not tested)  LOWER EXTREMITY MMT::     -Weak core MMT Right eval Left eval  Hip flexion 4+ 4 report of low back pain  Hip extension    Hip abduction    Hip adduction    Hip internal rotation    Hip external rotation    Knee flexion 4+ 4+  Knee extension 4+ 4+  Ankle dorsiflexion 5 5  Ankle plantarflexion 5 5  Ankle inversion    Ankle eversion     (Blank rows = not tested)  LOWER EXTREMITY ROM::    - grossly WNLS AROM Right eval Left eval  Hip flexion    Hip extension    Hip abduction    Hip adduction    Hip internal rotation    Hip external rotation    Knee flexion    Knee extension    Ankle dorsiflexion    Ankle plantarflexion    Ankle inversion    Ankle eversion     (Blank rows = not tested)  LUMBAR SPECIAL TESTS:  Straight leg raise test: Negative, Slump test: Negative, SI Compression/distraction test: Negative, and FABER test: Negative  FUNCTIONAL TESTS:  5 times sit to stand: TBA  GAIT: Distance walked: 225ft Assistive device utilized: None Level of  assistance: Complete Independence Comments: Decreased pace  TREATMENT DATE:  [facility] Adult PT Treatment:  DATE: 10/26/23 Therapeutic Exercise: Developed, instructed in, and pt completed therex as noted in HEP Self Care: Use of moist heat for pain management                                                                                                                              PATIENT EDUCATION:  Education details: Eval findings, POC, HEP, self care Person educated: Patient Education method: Explanation, Demonstration, Tactile cues, and Verbal cues Education comprehension: verbalized understanding, returned demonstration, verbal cues required, and tactile cues required  HOME EXERCISE PROGRAM: Access Code: [email or web address] URL: [email or web address] Date: 10/26/2023 Prepared by: Joellyn Rued  Exercises - Supine Lower Trunk Rotation  - 2 x daily - 7 x weekly - 1 sets - 10 reps - 5 hold - Supine Piriformis Stretch with Foot on Ground  - 2 x daily - 7 x weekly - 1 sets - 3 reps - 20 hold - Supine Figure 4 Piriformis Stretch  - 2 x daily - 7 x weekly - 1 sets - 3 reps - 20 hold - Supine March  - 2 x daily - 7 x weekly - 2 sets - 10 reps  ASSESSMENT:  CLINICAL IMPRESSION: Patient is a 68 y.o. female who was seen today for physical therapy evaluation and treatment for M54.16 (ICD-10-CM) - Radiculopathy, lumbar region. Pt presents with chronic R low back pain with intermittent radicular R LE pain and N/T. Pt's trunk movements are limited c pain being provoked, and pt's core is weak. A HEP was initiated for lumbopelvic flexibility and strengthening. Pt will benefit from skilled PT to address impairments to optimize back function with less pain.   OBJECTIVE IMPAIRMENTS: decreased activity tolerance, decreased ROM, decreased strength, postural dysfunction, and pain.   ACTIVITY LIMITATIONS: carrying, lifting, bending, standing, squatting, and  locomotion level  PARTICIPATION LIMITATIONS: meal prep, cleaning, laundry, community activity, and church  PERSONAL FACTORS: Fitness, past experiences, and length of time of pt's condition are also affecting patient's functional outcome.   REHAB POTENTIAL: Good  CLINICAL DECISION MAKING: Evolving/moderate complexity  EVALUATION COMPLEXITY: Moderate   GOALS: Goals reviewed with patient?   SHORT TERM GOALS + LTG   LONG TERM GOALS: Target date: 12/17/23  Pt will be Ind in a final HEP to maintain achieved LOF Baseline: Started Goal status: INITIAL  2.  Pt will voice understanding of measures to assist in pain reduction  Baseline: Started Goal status: INITIAL  3.  Pt' trunk ext and R side bending ROMs will improve to min limited for improved function  Baseline: mod limited Goal status: INITIAL  4.  Pt will rreport 50% or greater improvement of her R low back and LE pain with daily activities for improved function and QOL. Baseline:  Goal status: INITIAL  5.  Pt's Mod oswestry score will improve by the MCID to 56% as indication of improved function  Baseline: 68% Goal status: INITIAL  PLAN:  PT FREQUENCY: 2x/week  PT DURATION:  6 weeks  PLANNED INTERVENTIONS: 97164- PT Re-evaluation, 97110-Therapeutic exercises, 97530- Therapeutic activity, [id number]- Neuromuscular re-education, 97535- Self Care, 16109- Manual therapy, [id number]- Electrical stimulation (unattended), [id number]- Electrical stimulation (manual), [id number]- Ultrasound, Taping, Dry Needling, Joint mobilization, Spinal mobilization, Cryotherapy, and Moist heat.  PLAN FOR NEXT SESSION: Assess response to HEP; progress therex as indicated; use of modalities, manual therapy; and TPDN as indicated.   Georgine Wiltse MS, PT 10/27/23 1:25 PM

## 2023-11-09 ENCOUNTER — Ambulatory Visit: Payer: 59 | Admitting: Physical Therapy

## 2023-11-11 ENCOUNTER — Ambulatory Visit: Payer: 59 | Admitting: Physical Therapy

## 2023-11-15 ENCOUNTER — Emergency Department (HOSPITAL_BASED_OUTPATIENT_CLINIC_OR_DEPARTMENT_OTHER): Admitting: Radiology

## 2023-11-15 ENCOUNTER — Other Ambulatory Visit: Payer: Self-pay

## 2023-11-15 ENCOUNTER — Emergency Department (HOSPITAL_BASED_OUTPATIENT_CLINIC_OR_DEPARTMENT_OTHER)
Admission: EM | Admit: 2023-11-15 | Discharge: 2023-11-15 | Disposition: A | Discharge status: Home / Self Care | Attending: Emergency Medicine | Admitting: Emergency Medicine

## 2023-11-15 ENCOUNTER — Emergency Department (HOSPITAL_BASED_OUTPATIENT_CLINIC_OR_DEPARTMENT_OTHER)

## 2023-11-15 ENCOUNTER — Encounter (HOSPITAL_BASED_OUTPATIENT_CLINIC_OR_DEPARTMENT_OTHER): Payer: Self-pay

## 2023-11-15 DIAGNOSIS — I10 Essential (primary) hypertension: Secondary | ICD-10-CM

## 2023-11-15 DIAGNOSIS — Z79899 Other long term (current) drug therapy: Secondary | ICD-10-CM | POA: Insufficient documentation

## 2023-11-15 DIAGNOSIS — M7918 Myalgia, other site: Secondary | ICD-10-CM | POA: Diagnosis not present

## 2023-11-15 DIAGNOSIS — M542 Cervicalgia: Secondary | ICD-10-CM | POA: Diagnosis present

## 2023-11-15 DIAGNOSIS — R9082 White matter disease, unspecified: Secondary | ICD-10-CM | POA: Insufficient documentation

## 2023-11-15 DIAGNOSIS — Y92410 Unspecified street and highway as the place of occurrence of the external cause: Secondary | ICD-10-CM | POA: Diagnosis not present

## 2023-11-15 MED ORDER — ACETAMINOPHEN 325 MG PO TABS
650.0000 mg | ORAL_TABLET | Freq: Once | ORAL | Status: AC
  Administered 2023-11-15: 650 mg via ORAL
  Filled 2023-11-15: qty 2

## 2023-11-15 MED ORDER — METHOCARBAMOL 500 MG PO TABS: 1000.0000 mg | ORAL_TABLET | Freq: Three times a day (TID) | ORAL | 0 refills | Status: DC | PRN
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
## 2023-11-15 NOTE — ED Notes (Signed)
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
## 2023-11-16 ENCOUNTER — Ambulatory Visit: Payer: 59 | Attending: Orthopedic Surgery

## 2023-11-16 ENCOUNTER — Telehealth: Payer: Self-pay

## 2023-11-16 DIAGNOSIS — R293 Abnormal posture: Secondary | ICD-10-CM | POA: Insufficient documentation

## 2023-11-16 DIAGNOSIS — M5441 Lumbago with sciatica, right side: Secondary | ICD-10-CM | POA: Insufficient documentation

## 2023-11-16 DIAGNOSIS — M6281 Muscle weakness (generalized): Secondary | ICD-10-CM | POA: Insufficient documentation

## 2023-11-16 DIAGNOSIS — G8929 Other chronic pain: Secondary | ICD-10-CM | POA: Insufficient documentation
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
## 2023-11-18 ENCOUNTER — Ambulatory Visit: Payer: 59

## 2023-11-18 DIAGNOSIS — R293 Abnormal posture: Secondary | ICD-10-CM | POA: Diagnosis present

## 2023-11-18 DIAGNOSIS — M6281 Muscle weakness (generalized): Secondary | ICD-10-CM

## 2023-11-18 DIAGNOSIS — M5441 Lumbago with sciatica, right side: Secondary | ICD-10-CM | POA: Diagnosis present

## 2023-11-18 DIAGNOSIS — G8929 Other chronic pain: Secondary | ICD-10-CM | POA: Diagnosis present

## 2023-11-22 ENCOUNTER — Other Ambulatory Visit (INDEPENDENT_AMBULATORY_CARE_PROVIDER_SITE_OTHER)

## 2023-11-22 ENCOUNTER — Ambulatory Visit (INDEPENDENT_AMBULATORY_CARE_PROVIDER_SITE_OTHER): Payer: 59 | Admitting: Orthopedic Surgery

## 2023-11-22 DIAGNOSIS — M542 Cervicalgia: Secondary | ICD-10-CM | POA: Diagnosis not present

## 2023-11-22 DIAGNOSIS — M25512 Pain in left shoulder: Secondary | ICD-10-CM

## 2023-11-22 MED ORDER — METHYLPREDNISOLONE 4 MG PO TBPK: ORAL_TABLET | ORAL | 0 refills | Status: AC
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
## 2023-11-24 ENCOUNTER — Encounter: Payer: Self-pay | Admitting: Physical Therapy

## 2023-11-24 ENCOUNTER — Ambulatory Visit: Admitting: Physical Therapy

## 2023-11-24 DIAGNOSIS — R293 Abnormal posture: Secondary | ICD-10-CM

## 2023-11-24 DIAGNOSIS — M5441 Lumbago with sciatica, right side: Secondary | ICD-10-CM | POA: Diagnosis not present

## 2023-11-24 DIAGNOSIS — M6281 Muscle weakness (generalized): Secondary | ICD-10-CM

## 2023-11-24 DIAGNOSIS — G8929 Other chronic pain: Secondary | ICD-10-CM
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
## 2023-11-29 ENCOUNTER — Encounter: Payer: Self-pay | Admitting: Physical Therapy

## 2023-11-29 ENCOUNTER — Ambulatory Visit: Admitting: Physical Therapy

## 2023-11-29 DIAGNOSIS — R293 Abnormal posture: Secondary | ICD-10-CM

## 2023-11-29 DIAGNOSIS — G8929 Other chronic pain: Secondary | ICD-10-CM

## 2023-11-29 DIAGNOSIS — M6281 Muscle weakness (generalized): Secondary | ICD-10-CM

## 2023-11-29 DIAGNOSIS — M5441 Lumbago with sciatica, right side: Secondary | ICD-10-CM | POA: Diagnosis not present
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
## 2023-12-01 ENCOUNTER — Ambulatory Visit: Attending: Orthopedic Surgery

## 2023-12-01 DIAGNOSIS — M25562 Pain in left knee: Secondary | ICD-10-CM | POA: Diagnosis present

## 2023-12-01 DIAGNOSIS — G8929 Other chronic pain: Secondary | ICD-10-CM | POA: Insufficient documentation

## 2023-12-01 DIAGNOSIS — M25561 Pain in right knee: Secondary | ICD-10-CM | POA: Insufficient documentation

## 2023-12-01 DIAGNOSIS — R262 Difficulty in walking, not elsewhere classified: Secondary | ICD-10-CM | POA: Diagnosis present

## 2023-12-06 ENCOUNTER — Ambulatory Visit: Admitting: Physical Therapy

## 2023-12-08 ENCOUNTER — Ambulatory Visit

## 2023-12-20 ENCOUNTER — Ambulatory Visit: Admitting: Orthopedic Surgery

## 2024-01-06 ENCOUNTER — Ambulatory Visit (INDEPENDENT_AMBULATORY_CARE_PROVIDER_SITE_OTHER): Payer: Self-pay | Admitting: Orthopedic Surgery

## 2024-01-06 DIAGNOSIS — M25512 Pain in left shoulder: Secondary | ICD-10-CM | POA: Diagnosis not present

## 2024-01-06 MED ORDER — METHOCARBAMOL 750 MG PO TABS: 750.0000 mg | ORAL_TABLET | Freq: Three times a day (TID) | ORAL | 1 refills | Status: AC | PRN
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
## 2024-01-26 ENCOUNTER — Other Ambulatory Visit: Payer: Self-pay

## 2024-01-26 ENCOUNTER — Ambulatory Visit: Payer: Self-pay | Attending: Orthopedic Surgery

## 2024-01-26 DIAGNOSIS — M25512 Pain in left shoulder: Secondary | ICD-10-CM | POA: Diagnosis present

## 2024-01-26 DIAGNOSIS — G8929 Other chronic pain: Secondary | ICD-10-CM | POA: Insufficient documentation

## 2024-01-26 DIAGNOSIS — M6281 Muscle weakness (generalized): Secondary | ICD-10-CM | POA: Diagnosis present

## 2024-02-03 ENCOUNTER — Ambulatory Visit: Payer: Self-pay | Admitting: Physical Therapy
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
## 2024-02-09 ENCOUNTER — Ambulatory Visit: Payer: Self-pay | Attending: Orthopedic Surgery

## 2024-02-09 DIAGNOSIS — G8929 Other chronic pain: Secondary | ICD-10-CM | POA: Diagnosis present

## 2024-02-09 DIAGNOSIS — M6281 Muscle weakness (generalized): Secondary | ICD-10-CM | POA: Insufficient documentation

## 2024-02-09 DIAGNOSIS — M25512 Pain in left shoulder: Secondary | ICD-10-CM | POA: Diagnosis present

## 2024-02-11 ENCOUNTER — Encounter: Payer: Self-pay | Admitting: Physical Therapy

## 2024-02-11 ENCOUNTER — Ambulatory Visit: Payer: Self-pay | Admitting: Physical Therapy

## 2024-02-11 DIAGNOSIS — M6281 Muscle weakness (generalized): Secondary | ICD-10-CM

## 2024-02-11 DIAGNOSIS — M25512 Pain in left shoulder: Secondary | ICD-10-CM | POA: Diagnosis not present

## 2024-02-11 DIAGNOSIS — G8929 Other chronic pain: Secondary | ICD-10-CM
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
## 2024-02-15 ENCOUNTER — Ambulatory Visit: Payer: Self-pay

## 2024-02-15 DIAGNOSIS — G8929 Other chronic pain: Secondary | ICD-10-CM

## 2024-02-15 DIAGNOSIS — M6281 Muscle weakness (generalized): Secondary | ICD-10-CM

## 2024-02-15 DIAGNOSIS — M25512 Pain in left shoulder: Secondary | ICD-10-CM | POA: Diagnosis not present

## 2024-02-17 ENCOUNTER — Ambulatory Visit: Admitting: Orthopedic Surgery

## 2024-02-18 ENCOUNTER — Ambulatory Visit: Payer: Self-pay

## 2024-02-18 DIAGNOSIS — G8929 Other chronic pain: Secondary | ICD-10-CM

## 2024-02-18 DIAGNOSIS — M6281 Muscle weakness (generalized): Secondary | ICD-10-CM

## 2024-02-18 DIAGNOSIS — M25512 Pain in left shoulder: Secondary | ICD-10-CM | POA: Diagnosis not present
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
## 2024-02-22 ENCOUNTER — Ambulatory Visit: Payer: Self-pay
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
## 2024-02-24 ENCOUNTER — Ambulatory Visit: Payer: Self-pay

## 2024-03-16 ENCOUNTER — Ambulatory Visit (INDEPENDENT_AMBULATORY_CARE_PROVIDER_SITE_OTHER): Admitting: Orthopedic Surgery

## 2024-03-16 DIAGNOSIS — M25512 Pain in left shoulder: Secondary | ICD-10-CM | POA: Diagnosis not present

## 2024-03-16 NOTE — Progress Notes (Signed)
 Orthopedic Spine Surgery Office Note   Assessment: Patient is a 68 y.o. female several issues:   1) chronic low back pain which remains significantly improved 2) neck pain which has resolved 3) left shoulder pain which has resolved     Plan: -Patient has tried PT, Tylenol , lyrica , PT, medrol  dosepak - She has noticed significant improvement in her left shoulder pain since doing physical therapy for that.  Will not obtain an MRI as a result -Since she has noticed such improvement with physical therapy, encouraged her to continue with the home exercise program that she was taught for both her spine and her shoulder -Patient should return to office on an as-needed basis     Patient expressed understanding of the plan and all questions were answered to the patient's satisfaction.    ___________________________________________________________________________     History:   Patient is a 68 y.o. female who presents today for three issues.  She states that both her neck and her back are doing much better.  She has not noticed any changes since she was last seen.  She felt physical therapy was really helpful for both of those issues.  She has been keeping up with the home exercise program.  After our last visit, we started her on physical therapy specifically for the shoulder.  She has been working with the therapist and has found that treatment helpful as well.  She said she has mild pain in the shoulder at times but no consistent pain.  She is very grateful with the improvement that she has made.  She is not having any consistent symptoms in that shoulder anymore.   Treatments tried: PT, Tylenol , lyrica , medrol  dosepak   Physical Exam:   General: no acute distress, appears stated age Neurologic: alert, answering questions appropriately, following commands Respiratory: unlabored breathing on room air, symmetric chest rise Psychiatric: appropriate affect, normal cadence to speech     MSK  (spine):   -Strength exam                                                   Left                  Right Grip strength                5/5                  5/5 Interosseus                  5/5                  5/5 Wrist extension            5/5                  5/5 Wrist flexion                 5/5                  5/5 Elbow flexion                5/5                  5/5 Deltoid  4/5                  5/5   EHL                              5/5                  5/5 TA                                 5/5                  5/5 GSC                             5/5                  5/5 Knee extension            5/5                  5/5 Hip flexion                    5/5                  5/5   -Sensory exam                                       Sensation intact to light touch in C5-T1 distributions of bilateral upper extremities             Sensation intact to light touch in L3-S1 nerve distributions of bilateral lower extremities   -Left shoulder exam: No pain through range of motion, range of motion symmetric to contralateral side, negative Jobe, negative belly press, no weakness with external rotation with arm at side, negative speeds   Imaging: XRs of the lumbar spine from 10/12/2023 were previously independently reviewed and interpreted, showing disc height loss at L4/5. No other significant degenerative changes seen. No evidence of instability on flexion/extension views. No fracture or dislocation seen.   MRI of the lumbar spine from 01/05/2023 was previously independently reviewed and interpreted, showing small left paracentral disc herniation at L1/2. No significant stenosis seen.    XRs of the left shoulder from 11/22/2023 were previously independently reviewed and interpreted, showing no significant degenerative changes within the glenohumeral joint. No fracture or dislocation seen.    XRs of the cervical spine from 11/22/2023 were previously independently reviewed  and interpreted, showing no significant degenerative changes. No evidence of instability on flexion/extension views. No fracture or dislocation seen.      Patient name: Janice Deleon Patient MRN: [id number] Date of visit: 03/16/24

## 2024-03-17 ENCOUNTER — Encounter: Payer: Self-pay | Admitting: Advanced Practice Midwife

## 2024-07-03 ENCOUNTER — Encounter: Payer: Self-pay | Admitting: Radiology
